# Patient Record
Sex: Female | Born: 1990 | Race: White | Hispanic: No | Marital: Married | Smoking: Never smoker
Health system: Southern US, Community
[De-identification: ages and names within clinical notes are randomized; demographics above are authoritative.]

## PROBLEM LIST (undated history)

## (undated) ENCOUNTER — Inpatient Hospital Stay (HOSPITAL_COMMUNITY): Payer: Self-pay

## (undated) ENCOUNTER — Inpatient Hospital Stay: Payer: Self-pay

## (undated) DIAGNOSIS — Z309 Encounter for contraceptive management, unspecified: Secondary | ICD-10-CM

## (undated) HISTORY — PX: SKIN BIOPSY: SHX1

## (undated) HISTORY — DX: Encounter for contraceptive management, unspecified: Z30.9

---

## 2016-05-02 DIAGNOSIS — D239 Other benign neoplasm of skin, unspecified: Secondary | ICD-10-CM

## 2016-05-02 HISTORY — DX: Other benign neoplasm of skin, unspecified: D23.9

## 2016-07-05 ENCOUNTER — Encounter: Payer: Self-pay | Admitting: Obstetrics and Gynecology

## 2016-11-27 ENCOUNTER — Telehealth: Payer: Self-pay | Admitting: Certified Nurse Midwife

## 2016-11-27 NOTE — Telephone Encounter (Signed)
Patient coming 4/18 for Mirena insert with CLG at 2:10pm

## 2016-12-17 NOTE — Telephone Encounter (Signed)
Noted. Will order to arrive by apt date/time. 

## 2016-12-18 ENCOUNTER — Ambulatory Visit: Payer: Self-pay | Admitting: Certified Nurse Midwife

## 2016-12-20 ENCOUNTER — Ambulatory Visit (INDEPENDENT_AMBULATORY_CARE_PROVIDER_SITE_OTHER): Payer: BLUE CROSS/BLUE SHIELD | Admitting: Obstetrics and Gynecology

## 2016-12-20 ENCOUNTER — Encounter: Payer: Self-pay | Admitting: Obstetrics and Gynecology

## 2016-12-20 VITALS — BP 100/60 | HR 80 | Ht 65.0 in | Wt 172.0 lb

## 2016-12-20 DIAGNOSIS — Z30011 Encounter for initial prescription of contraceptive pills: Secondary | ICD-10-CM | POA: Diagnosis not present

## 2016-12-20 MED ORDER — NORGESTIMATE-ETH ESTRADIOL 0.25-35 MG-MCG PO TABS: 1.0000 | ORAL_TABLET | Freq: Every day | ORAL | 11 refills | Status: DC

## 2016-12-20 NOTE — Progress Notes (Signed)
    Obstetrics & Gynecology Office Visit   Chief Complaint  Patient presents with  . Contraception counseling    History of Present Illness: Patient is a 26 y.o. G0P0000 presenting for contraception consult.  She is currently on none and desiring to start OCP (estrogen/progesterone).  She has a past medical history significant for no contraindication to estrogen.  She specifically denies a history of migraine with aura, chronic hypertension, history of DVT/PE and smoking.  Reported Patient's last menstrual period was 12/19/2016.Marland Kitchen     Review of Systems: Review of Systems  Constitutional: Negative.   HENT: Negative.   Eyes: Negative.   Respiratory: Negative.   Cardiovascular: Negative.   Gastrointestinal: Negative.   Genitourinary: Negative.   Musculoskeletal: Negative.   Skin: Negative.   Neurological: Negative.   Psychiatric/Behavioral: Negative.     Past Medical History:  History reviewed. No pertinent past medical history.  Past Surgical History:  History reviewed. No pertinent surgical history.  Gynecologic History: Patient's last menstrual period was 12/19/2016.  Obstetric History: G0P0000  Family History  Problem Relation Age of Onset  . Skin cancer Paternal Grandmother     Social History   Social History  . Marital status: Single    Spouse name: N/A  . Number of children: N/A  . Years of education: N/A   Occupational History  . Not on file.   Social History Main Topics  . Smoking status: Never Smoker  . Smokeless tobacco: Never Used  . Alcohol use Yes  . Drug use: No  . Sexual activity: No   Other Topics Concern  . Not on file   Social History Narrative  . No narrative on file    Allergies: No Known Allergies  Medications: Prior to Admission medications   None   Family History  Problem Relation Age of Onset  . Skin cancer Paternal Grandmother     Physical Exam Vitals:  Vitals:   12/20/16 1515  BP: 100/60  Pulse: 80   Patient's  last menstrual period was 12/19/2016.  General: NAD HEENT: normocephalic, anicteric Pulmonary: No increased work of breathing Extremities: no edema, erythema, or tenderness Neurologic: Grossly intact Psychiatric: mood appropriate, affect full   Assessment: 26 y.o. G0P0000 here for contraception counseling  Plan: Reviewed all forms of birth control options available including abstinence; over the counter/barrier methods; hormonal contraceptive medication including pill, patch, ring, injection,contraceptive implant; hormonal and nonhormonal IUDs; permanent sterilization options including vasectomy and the various tubal sterilization modalities. Risks and benefits reviewed.  Questions were answered.  Information was given to patient to review.   Patient plans OCPs for contraception. Pros and cons and systemic estrogen discussed with patient. She is not breast feeding, nor does she have any other medical contra-indications to estrogen use as listed in WHO guidelines for contraceptive use.  While effective at preventing pregnancy combination oral contraceptive pills do not prevent transmission of sexually transmitted diseases and use of barrier methods for this purpose was discussed.    Encounter for initial prescription of contraceptive pills - Plan: norgestimate-ethinyl estradiol (ORTHO-CYCLEN,SPRINTEC,PREVIFEM) 0.25-35 MG-MCG tablet    Prentice Docker, MD 12/20/2016 3:47 PM

## 2016-12-20 NOTE — Telephone Encounter (Signed)
Pt is schedule 12/20/16 with Dr. Glennon Mac for Mirena insertion

## 2017-01-01 ENCOUNTER — Ambulatory Visit: Payer: BLUE CROSS/BLUE SHIELD | Admitting: Certified Nurse Midwife

## 2017-08-25 ENCOUNTER — Ambulatory Visit: Payer: BLUE CROSS/BLUE SHIELD | Admitting: Certified Nurse Midwife

## 2017-10-03 ENCOUNTER — Encounter: Payer: Self-pay | Admitting: Certified Nurse Midwife

## 2017-10-03 ENCOUNTER — Ambulatory Visit (INDEPENDENT_AMBULATORY_CARE_PROVIDER_SITE_OTHER): Payer: BLUE CROSS/BLUE SHIELD | Admitting: Certified Nurse Midwife

## 2017-10-03 VITALS — BP 114/74 | HR 71 | Ht 66.0 in | Wt 184.0 lb

## 2017-10-03 DIAGNOSIS — Z309 Encounter for contraceptive management, unspecified: Secondary | ICD-10-CM | POA: Insufficient documentation

## 2017-10-03 DIAGNOSIS — Z01419 Encounter for gynecological examination (general) (routine) without abnormal findings: Secondary | ICD-10-CM

## 2017-10-03 DIAGNOSIS — Z124 Encounter for screening for malignant neoplasm of cervix: Secondary | ICD-10-CM

## 2017-10-03 DIAGNOSIS — Z30014 Encounter for initial prescription of intrauterine contraceptive device: Secondary | ICD-10-CM

## 2017-10-03 HISTORY — DX: Encounter for contraceptive management, unspecified: Z30.9

## 2017-10-03 NOTE — Progress Notes (Signed)
Gynecology Annual Exam  PCP: Patient, No Pcp Per  Chief Complaint:  Chief Complaint  Patient presents with  . Gynecologic Exam    History of Present Illness:Bethany Aguilar is a 27 year old female, G0 P0000, who presents for her annual gyn exam. She is having no significant GYN problems. Married Dominica Severin this past year and is enjoying married life. Her menses are regular and her LMP was 09/23/2017. They occur every 1 month, they last 5 days, are medium flow, and are without clots.  She has had no spotting.  She reports dysmenorrhea. She uses heating pad and occasionally uses ibuprofen 600 mgm with symptomatic relief.  The patient's past medical history is unremarkable.  Since her last annual GYN exam 08/16/2016 , she started taking Sprintec. Has gained 25# since her last visit. She just started back exercising to help her lose some weight. She is wanting to switch her method of BC to the Paraguard IUD. She is sexually active. No dyspareunia Her most recent pap smear was obtained 1 year ago and was normal.  Mammogram is not applicable.  There is no family history of breast cancer.  There is no family history of ovarian cancer.  The patient does do occ self breast exams.  The patient does not smoke.  The patient does drink 1-2x /month  The patient does not use illegal drugs.  The patient exercises regularly.  The patient does get adequate calcium in her diet.  She has not had a recent cholesterol screen and is interested in labwork.     Review of Systems: Review of Systems  Constitutional: Negative for chills, fever and weight loss.       Positive for weight gain  HENT: Negative for congestion, sinus pain and sore throat.   Eyes: Negative for blurred vision and pain.  Respiratory: Negative for hemoptysis, shortness of breath and wheezing.   Cardiovascular: Negative for chest pain, palpitations and leg swelling.  Gastrointestinal: Negative for abdominal pain, blood in stool,  diarrhea, heartburn, nausea and vomiting.  Genitourinary: Negative for dysuria, frequency, hematuria and urgency.       Positive for dysmenorrhea  Musculoskeletal: Positive for joint pain (knees). Negative for back pain and myalgias.  Skin: Negative for itching and rash.  Neurological: Negative for dizziness, tingling and headaches.  Endo/Heme/Allergies: Negative for environmental allergies and polydipsia. Does not bruise/bleed easily.       Negative for hirsutism   Psychiatric/Behavioral: Negative for depression. The patient is not nervous/anxious and does not have insomnia.     Past Medical History:  Past Medical History:  Diagnosis Date  . Contraceptive management     Past Surgical History:  Past Surgical History:  Procedure Laterality Date  . SKIN BIOPSY     back, stomach and leg; atypical cells    Family History:  Family History  Problem Relation Age of Onset  . Skin cancer Paternal Grandmother   . Diabetes Maternal Aunt   . Diabetes Maternal Grandfather   . Skin cancer Maternal Grandfather   . Breast cancer Neg Hx   . Ovarian cancer Neg Hx     Social History:  Social History   Socioeconomic History  . Marital status: Married    Spouse name: Dominica Severin  . Number of children: 0  . Years of education: Not on file  . Highest education level: Not on file  Social Needs  . Financial resource strain: Not on file  . Food insecurity - worry: Not on file  .  Food insecurity - inability: Not on file  . Transportation needs - medical: Not on file  . Transportation needs - non-medical: Not on file  Occupational History  . Not on file  Tobacco Use  . Smoking status: Never Smoker  . Smokeless tobacco: Never Used  Substance and Sexual Activity  . Alcohol use: Yes    Comment: 1-2/month  . Drug use: No  . Sexual activity: Yes    Partners: Male    Birth control/protection: Pill  Other Topics Concern  . Not on file  Social History Narrative  . Not on file    Allergies:    No Known Allergies  Medications: Prior to Admission medications   Medication Sig Start Date End Date Taking? Authorizing Provider  norgestimate-ethinyl estradiol (ORTHO-CYCLEN,SPRINTEC,PREVIFEM) 0.25-35 MG-MCG tablet Take 1 tablet by mouth daily. 12/20/16   Will Bonnet, MD    Physical Exam Vitals: BP 114/74   Pulse 71   Ht 5\' 6"  (1.676 m)   Wt 184 lb (83.5 kg)   LMP 09/23/2017 (Exact Date)   BMI 29.70 kg/m   General: WF in NAD HEENT: normocephalic, anicteric Neck: no thyroid enlargement, no palpable nodules, no cervical lymphadenopathy  Pulmonary: No increased work of breathing, CTAB Cardiovascular: RRR, without murmur  Breast: Breast symmetrical, no tenderness, no palpable nodules or masses, no skin or nipple retraction present, no nipple discharge.  No axillary, infraclavicular or supraclavicular lymphadenopathy. Abdomen: Soft, non-tender, non-distended.  Umbilicus without lesions.  No hepatomegaly or masses palpable. No evidence of hernia. Genitourinary:  External: Normal external female genitalia.  Normal urethral meatus, normal Bartholin's and Skene's glands.    Vagina: Normal vaginal mucosa, no evidence of prolapse.    Cervix: Grossly normal in appearance, no bleeding, non-tender, everted  Uterus: Retroverted, normal size, shape, and consistency, mobile, and non-tender  Adnexa: No adnexal masses, non-tender  Rectal: deferred  Lymphatic: no evidence of inguinal lymphadenopathy Extremities: no edema, erythema, or tenderness Neurologic: Grossly intact Psychiatric: mood appropriate, affect full     Assessment: 27 y.o. G0P0000 annual gyn exam Contraceptive management  Plan:   1) Breast cancer screening - recommend monthly self breast exam  2) Cervical cancer screening - Pap was done.   3) Contraception - Education given regarding Paraguard IUD. Explained pros and cons, how the IUD is inserted, possible side effects and risks including expulsion, perforation of  uterus, infection, and irregular bleeding and cramping. Patient to call when on menses to schedule the insertion. Given Paraguard insertion pamphlet.  4) Routine healthcare maintenance including cholesterol and diabetes screening at the time of her IUD insertion.   Dalia Heading, CNM

## 2017-10-07 LAB — IGP,RFX APTIMA HPV ALL PTH: PAP Smear Comment: 0

## 2017-11-08 ENCOUNTER — Other Ambulatory Visit: Payer: Self-pay | Admitting: Obstetrics and Gynecology

## 2017-11-08 DIAGNOSIS — Z30011 Encounter for initial prescription of contraceptive pills: Secondary | ICD-10-CM

## 2017-11-21 ENCOUNTER — Other Ambulatory Visit: Payer: Self-pay | Admitting: Obstetrics and Gynecology

## 2017-11-21 DIAGNOSIS — Z30011 Encounter for initial prescription of contraceptive pills: Secondary | ICD-10-CM

## 2017-11-21 NOTE — Telephone Encounter (Signed)
Pt notified that full year rx was sent by [facility] on 11/08/17 to [facility]

## 2017-11-21 NOTE — Telephone Encounter (Signed)
Pt states she will run out of OCP tomorrow. Pt states pharmacy says she is out of refills. [phone]

## 2018-04-17 ENCOUNTER — Encounter (HOSPITAL_COMMUNITY): Payer: Self-pay | Admitting: Emergency Medicine

## 2018-04-17 ENCOUNTER — Ambulatory Visit (HOSPITAL_COMMUNITY)
Admission: EM | Admit: 2018-04-17 | Discharge: 2018-04-17 | Disposition: A | Discharge status: Home / Self Care | Payer: 59 | Attending: Internal Medicine | Admitting: Internal Medicine

## 2018-04-17 DIAGNOSIS — J029 Acute pharyngitis, unspecified: Secondary | ICD-10-CM | POA: Diagnosis present

## 2018-04-17 LAB — POCT RAPID STREP A: Streptococcus, Group A Screen (Direct): NEGATIVE

## 2018-04-17 MED ORDER — MOMETASONE FUROATE 50 MCG/ACT NA SUSP: 2.0000 | Freq: Every day | NASAL | 12 refills | Status: DC

## 2018-04-17 NOTE — ED Triage Notes (Signed)
Pt c/o sore throat, unsure if strep or congestion.

## 2018-04-17 NOTE — ED Provider Notes (Signed)
[facility]    CSN: [id number] Arrival date & time: 04/17/18  1239     History   Chief Complaint Chief Complaint  Patient presents with  . Appointment    1pm  . Sore Throat    HPI Bethany Aguilar is a 27 y.o. female.   Bethany Aguilar presents with complaints of sore throat and ear pressure bilaterally which is worse when she wakes in the morning. Started two days ago. Symptoms primarily at night and in the morning. Feels well during the day. Had chills at original onset but denies any fevers since. No rash. No nausea or vomiting. Has been taking sudafed which has helped some. No known ill contacts. Slight cough. This morning felt worse than yesterday am, but feels improved currently. Without contributing medical history.     ROS per HPI.      Past Medical History:  Diagnosis Date  . Contraceptive management     Patient Active Problem List   Diagnosis Date Noted  . Contraceptive management 10/03/2017    Past Surgical History:  Procedure Laterality Date  . SKIN BIOPSY     back, stomach and leg; atypical cells    OB History    Gravida  0   Para  0   Term  0   Preterm  0   AB  0   Living  0     SAB  0   TAB  0   Ectopic  0   Multiple  0   Live Births  0            Home Medications    Prior to Admission medications   Medication Sig Start Date End Date Taking? Authorizing Provider  mometasone (NASONEX) 50 MCG/ACT nasal spray Place 2 sprays into the nose daily. 04/17/18   Zigmund Gottron, NP  SPRINTEC 28 0.25-35 MG-MCG tablet TAKE 1 TABLET BY MOUTH DAILY. 11/08/17   Will Bonnet, MD  [place] 28 0.25-35 MG-MCG tablet TAKE 1 TABLET BY MOUTH DAILY. 11/21/17   Will Bonnet, MD    Family History Family History  Problem Relation Age of Onset  . Skin cancer Paternal Grandmother   . Diabetes Maternal Aunt   . Diabetes Maternal Grandfather   . Skin cancer Maternal Grandfather   . Breast cancer Neg Hx   . Ovarian cancer Neg Hx      Social History Social History   Tobacco Use  . Smoking status: Never Smoker  . Smokeless tobacco: Never Used  Substance Use Topics  . Alcohol use: Yes    Comment: 1-2/month  . Drug use: No     Allergies   Patient has no known allergies.   Review of Systems Review of Systems   Physical Exam Triage Vital Signs ED Triage Vitals [04/17/18 1300]  Enc Vitals Group     BP 131/70     Pulse Rate 79     Resp 16     Temp 97.9 F (36.6 C)     Temp src      SpO2 99 %     Weight      Height      Head Circumference      Peak Flow      Pain Score      Pain Loc      Pain Edu?      Excl. in [place]?    No data found.  Updated Vital Signs BP 131/70   Pulse 79  Temp 97.9 F (36.6 C)   Resp 16   SpO2 99%    Physical Exam  Constitutional: She is oriented to person, place, and time. She appears well-developed and well-nourished. No distress.  HENT:  Head: Normocephalic and atraumatic.  Right Ear: Tympanic membrane, external ear and ear canal normal.  Left Ear: Tympanic membrane, external ear and ear canal normal.  Nose: Nose normal.  Mouth/Throat: Uvula is midline, oropharynx is clear and moist and mucous membranes are normal. No tonsillar exudate.  Eyes: Pupils are equal, round, and reactive to light. Conjunctivae and EOM are normal.  Cardiovascular: Normal rate, regular rhythm and normal heart sounds.  Pulmonary/Chest: Effort normal and breath sounds normal.  Lymphadenopathy:    She has no cervical adenopathy.  Neurological: She is alert and oriented to person, place, and time.  Skin: Skin is warm and dry.     UC Treatments / Results  Labs (all labs ordered are listed, but only abnormal results are displayed) Labs Reviewed  CULTURE, GROUP A STREP [facility])  POCT RAPID STREP A    EKG None  Radiology No results found.  Procedures Procedures (including critical care time)  Medications Ordered in UC Medications - No data to display  Initial Impression /  Assessment and Plan / UC Course  I have reviewed the triage vital signs and the nursing notes.  Pertinent labs & imaging results that were available during my care of the patient were reviewed by me and considered in my medical decision making (see chart for details).     Afebrile, non toxic in appearance. Negative rapid strep. Benign physical exam. History and physical consistent with viral illness.  Supportive cares recommended. If symptoms worsen or do not improve in the next week to return to be seen or to follow up with PCP.  Patient verbalized understanding and agreeable to plan.   Final Clinical Impressions(s) / UC Diagnoses   Final diagnoses:  Viral pharyngitis     Discharge Instructions     Push fluids to ensure adequate hydration and keep secretions thin.  Tylenol and/or ibuprofen as needed for pain or fevers.  Daily nasonex. Use of mucinex as an expectorant may be helpful, may continue with sudafed as needed. If symptoms worsen or do not improve in the next week to return to be seen or to follow up with your PCP.     ED Prescriptions    Medication Sig Dispense Auth. Provider   mometasone (NASONEX) 50 MCG/ACT nasal spray Place 2 sprays into the nose daily. 17 g Zigmund Gottron, NP     Controlled Substance Prescriptions [place] Controlled Substance Registry consulted? Not Applicable   Zigmund Gottron, NP 04/17/18 1326

## 2018-04-17 NOTE — ED Notes (Signed)
Bed: UC01 Expected date:  Expected time:  Means of arrival:  Comments: appointments

## 2018-04-17 NOTE — Discharge Instructions (Signed)
Push fluids to ensure adequate hydration and keep secretions thin.  Tylenol and/or ibuprofen as needed for pain or fevers.  Daily nasonex. Use of mucinex as an expectorant may be helpful, may continue with sudafed as needed. If symptoms worsen or do not improve in the next week to return to be seen or to follow up with your PCP.

## 2018-04-19 LAB — CULTURE, GROUP A STREP (THRC)

## 2018-04-21 ENCOUNTER — Telehealth (HOSPITAL_COMMUNITY): Payer: Self-pay

## 2018-04-21 NOTE — Telephone Encounter (Signed)
Culture is positive for non group A Strep germ.  This is a finding of uncertain significance; not the typical 'strep throat' germ.  Attempted to reach patient to follow up. No answer at this time.

## 2018-08-06 ENCOUNTER — Encounter: Payer: Self-pay | Admitting: Certified Nurse Midwife

## 2018-08-06 ENCOUNTER — Ambulatory Visit (INDEPENDENT_AMBULATORY_CARE_PROVIDER_SITE_OTHER): Payer: 59 | Admitting: Certified Nurse Midwife

## 2018-08-06 VITALS — BP 106/70 | HR 78 | Ht 66.0 in | Wt 189.5 lb

## 2018-08-06 DIAGNOSIS — M79605 Pain in left leg: Secondary | ICD-10-CM | POA: Diagnosis not present

## 2018-08-06 DIAGNOSIS — Z30015 Encounter for initial prescription of vaginal ring hormonal contraceptive: Secondary | ICD-10-CM | POA: Diagnosis not present

## 2018-08-06 NOTE — Progress Notes (Signed)
Obstetrics & Gynecology Office Visit   Chief Complaint:  Chief Complaint  Patient presents with  . Contraception    History of Present Illness: Bethany Aguilar is a 27 year old female, G0 P0000, who presents to discuss switching contraceptive method from OCPs to [place]. She currently uses Sprintec. Has gained weight on the Sprintec and complains of mood swings and irritability on the OCPs.  She also reports a chronic pain in her left popliteal area that has worsened with sitting for long periods of time at work   Review of Systems:  ROS   Past Medical History:  Past Medical History:  Diagnosis Date  . Contraceptive management     Past Surgical History:  Past Surgical History:  Procedure Laterality Date  . SKIN BIOPSY     back, stomach and leg; atypical cells    Gynecologic History: Patient's last menstrual period was 08/02/2018.  Obstetric History: G0P0000  Family History:  Family History  Problem Relation Age of Onset  . Skin cancer Paternal Grandmother   . Diabetes Maternal Aunt   . Diabetes Maternal Grandfather   . Skin cancer Maternal Grandfather   . Breast cancer Neg Hx   . Ovarian cancer Neg Hx     Social History:  Social History   Socioeconomic History  . Marital status: Married    Spouse name: Dominica Severin  . Number of children: 0  . Years of education: 72  . Highest education level: Not on file  Occupational History  . 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 Financial resource strain: Not on file  . Food insecurity:    Worry: Not on file    Inability: Not on file  . Transportation needs:    Medical: Not on file    Non-medical: Not on file  Tobacco Use  . Smoking status: Never Smoker  . Smokeless tobacco: Never Used  Substance and Sexual Activity  . Alcohol use: Yes    Comment: 1-2/month  . Drug use: No  . Sexual activity: Yes    Partners: Male    Birth control/protection: Pill  Lifestyle  . Physical activity:    Days per  week: 5 days    Minutes per session: 40 min  . Stress: Not at all  Relationships  . Social connections:    Talks on phone: Three times a week    Gets together: More than three times a week    Attends religious service: More than 4 times per year    Active member of club or organization: Yes    Attends meetings of clubs or organizations: More than 4 times per year    Relationship status: Married  . Intimate partner violence:    Fear of current or ex partner: No    Emotionally abused: No    Physically abused: No    Forced sexual activity: No  Other Topics Concern  . Not on file  Social History Narrative  . Not on file    Allergies:  No Known Allergies  Medications: Prior to Admission medications   Not on File    Physical Exam Vitals:BP 106/70   Pulse 78   Ht 5\' 6"  (1.676 m)   Wt 189 lb 8 oz (86 kg)   LMP 08/02/2018   BMI 30.59 kg/m  Patient's last menstrual period was 08/02/2018.  Physical Exam  Constitutional: She is oriented to person, place, and time. She appears well-developed and well-nourished.  Cardiovascular: Normal rate.  Respiratory: Effort normal.  Neurological: She is alert and oriented to person, place, and time.  Psychiatric: She has a normal mood and affect.  Extremities: No edema of feet and ankles. No calf redness or tenderness On left popliteal area there is swelling and tenderness, no inflammation On right: no swelling or inflammation of popliteal area   Assessment: 27 y.o. G0P0000 left leg pain and popliteal swelling R/O DVT Contraceptive management  Plan: Venous ultrasound of left lower extremitiy Will call  With results If negative for DVT, can start Nuvaring Discussed how to insert, how to use, MOA, possible side effects and risks of thromboembolism. GIven one sample ring. Will call in RX if doppler study is negative. FU at time of annual in Jan/Feb 2020.  Dalia Heading, CNM

## 2018-08-07 ENCOUNTER — Telehealth: Payer: Self-pay | Admitting: Certified Nurse Midwife

## 2018-08-07 NOTE — Telephone Encounter (Signed)
Patient returned the call, and is aware of the appointment and location. Patient was given Centralized Scheduling's phone# in the event she needs to reschedule.

## 2018-08-07 NOTE — Telephone Encounter (Signed)
Patient is scheduled for venous ultrasound on Tuesday, 08/11/18 @ 3:00pm at [facility]. Patient should arrive @ 2:45pm at the [facility] registration desk. Lmtrc.

## 2018-08-11 ENCOUNTER — Ambulatory Visit
Admission: RE | Admit: 2018-08-11 | Discharge: 2018-08-11 | Disposition: A | Discharge status: Home / Self Care | Payer: 59 | Source: Ambulatory Visit | Attending: Certified Nurse Midwife | Admitting: Certified Nurse Midwife

## 2018-08-11 DIAGNOSIS — M79605 Pain in left leg: Secondary | ICD-10-CM | POA: Insufficient documentation

## 2018-08-18 ENCOUNTER — Telehealth: Payer: Self-pay | Admitting: Certified Nurse Midwife

## 2018-08-18 ENCOUNTER — Other Ambulatory Visit: Payer: Self-pay | Admitting: Certified Nurse Midwife

## 2018-08-18 MED ORDER — ETONOGESTREL-ETHINYL ESTRADIOL 0.12-0.015 MG/24HR VA RING: VAGINAL_RING | VAGINAL | 4 refills | Status: DC

## 2018-08-18 NOTE — Telephone Encounter (Signed)
Patient called and advised there was no evidence of DVT on recent Doppler study of left lower leg. RX for Nuvaring sent to pharmacy. Can start it's use. Dalia Heading, CNM

## 2018-08-27 ENCOUNTER — Other Ambulatory Visit: Payer: Self-pay | Admitting: Certified Nurse Midwife

## 2018-08-31 ENCOUNTER — Telehealth: Payer: Self-pay

## 2018-08-31 NOTE — Telephone Encounter (Signed)
Pt calling to see why nuvaring refill was refused.  [phone].  Called pharm; they said she p/u refill on the 3rd and has 2refills left.  Called pt.  She is now aware refused on the 12th d/t eRx'd on the 3rd 1 c 4rf.  Pt to contact pharmacy.

## 2018-10-06 ENCOUNTER — Ambulatory Visit: Payer: 59 | Admitting: Certified Nurse Midwife

## 2018-11-03 NOTE — Progress Notes (Signed)
Gynecology Annual Exam  PCP: Patient, No Pcp Per  Chief Complaint:  Chief Complaint  Patient presents with  . Gynecologic Exam    History of Present Illness:Bethany Aguilar is a 28 year old female, G0 P0000, who presents for her annual gyn exam. She had switched to using Nuvaring from [place] and has had less mood swings and has not gained anymore weight. She has had a decreased sex drive since starting to use OCPs.  Her menses are regular and her LMP was 10/17/18. They occur every 1 month, they last 4 days, are light to medium flow, and are without clots.  She has had no spotting.  She reports mild dysmenorrhea. She uses heating pad and occasionally uses ibuprofen 600 mgm with symptomatic relief.  The patient's past medical history is unremarkable.  Since her last annual GYN exam 10/03/2017 , she was seen for contraception management in November and reported popliteal pain on the Sprintec. She had a negative Doppler study for DVT. She also had some atypical moles removed 4 mos ago by dermatology (Dr Nehemiah Massed) She is sexually active. No dyspareunia Her most recent pap smear was obtained 10/03/17 and was normal.  Mammogram is not applicable.  There is no family history of breast cancer.  There is no family history of ovarian cancer.  The patient does do self breast exams.  The patient does not smoke.  The patient rarely drinks alcohol. The patient does not use illegal drugs.  The patient exercises regularly 2-3x/week The patient does get adequate calcium in her diet.  She has not had a recent cholesterol screen and is not interested in labwork.     Review of Systems: Review of Systems  Constitutional: Negative for chills, fever and weight loss.       Decreased appetite  HENT: Negative for congestion, sinus pain and sore throat.   Eyes: Negative for blurred vision and pain.  Respiratory: Negative for hemoptysis, shortness of breath and wheezing.   Cardiovascular: Negative  for chest pain, palpitations and leg swelling.  Gastrointestinal: Positive for nausea and vomiting. Negative for abdominal pain, blood in stool, diarrhea and heartburn.  Genitourinary: Negative for dysuria, frequency, hematuria and urgency.       Positive for dysmenorrhea and vaginal discharge; positive for decreased libido.  Musculoskeletal: Positive for joint pain (knees). Negative for back pain and myalgias.  Skin: Negative for itching and rash.  Neurological: Positive for headaches. Negative for dizziness and tingling.  Endo/Heme/Allergies: Negative for environmental allergies and polydipsia. Does not bruise/bleed easily.       Negative for hirsutism   Psychiatric/Behavioral: Negative for depression. The patient is not nervous/anxious and does not have insomnia.     Past Medical History:  Past Medical History:  Diagnosis Date  . Contraceptive management     Past Surgical History:  Past Surgical History:  Procedure Laterality Date  . SKIN BIOPSY     back, stomach and leg; atypical cells    Family History:  Family History  Problem Relation Age of Onset  . Skin cancer Paternal Grandmother   . Diabetes Maternal Aunt   . Diabetes Maternal Grandfather   . Skin cancer Maternal Grandfather   . Breast cancer Neg Hx   . Ovarian cancer Neg Hx     Social History:  Social History   Socioeconomic History  . Marital status: Married    Spouse name: Dominica Severin  . Number of children: 0  . Years of education: 22  .  Highest education level: Not on file  Occupational History  . 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 Financial resource strain: Not on file  . Food insecurity:    Worry: Not on file    Inability: Not on file  . Transportation needs:    Medical: Not on file    Non-medical: Not on file  Tobacco Use  . Smoking status: Never Smoker  . Smokeless tobacco: Never Used  Substance and Sexual Activity  . Alcohol use: Yes    Comment: 1-2/month  . Drug use: No  .  Sexual activity: Yes    Partners: Male    Birth control/protection: Inserts  Lifestyle  . Physical activity:    Days per week: 5 days    Minutes per session: 40 min  . Stress: Not at all  Relationships  . Social connections:    Talks on phone: Three times a week    Gets together: More than three times a week    Attends religious service: More than 4 times per year    Active member of club or organization: Yes    Attends meetings of clubs or organizations: More than 4 times per year    Relationship status: Married  . Intimate partner violence:    Fear of current or ex partner: No    Emotionally abused: No    Physically abused: No    Forced sexual activity: No  Other Topics Concern  . Not on file  Social History Narrative  . Not on file    Allergies:  No Known Allergies  Medications: Scheduled Meds: Continuous Infusions: PRN Meds:. Physical Exam Vitals: BP 118/70   Pulse 78   Ht 5\' 6"  (1.676 m)   Wt 184 lb (83.5 kg)   LMP 10/17/2018 (Exact Date)   BMI 29.70 kg/m   General: WF in NAD HEENT: normocephalic, anicteric Neck: no thyroid enlargement, no palpable nodules, no cervical lymphadenopathy  Pulmonary: No increased work of breathing, CTAB Cardiovascular: RRR, without murmur  Breast: Breast symmetrical, no tenderness, no palpable nodules or masses, no skin or nipple retraction present, no nipple discharge.  No axillary, infraclavicular or supraclavicular lymphadenopathy. Abdomen: Soft, non-tender, non-distended.  Umbilicus without lesions.  No hepatomegaly or masses palpable. No evidence of hernia. Genitourinary:  External: Normal external female genitalia.  Normal urethral meatus, normal Bartholin's and Skene's glands.    Vagina: Normal vaginal mucosa, no evidence of prolapse.    Cervix: Grossly normal in appearance, non-tender, everted  Uterus: Retroverted, normal size, shape, and consistency, mobile, and non-tender  Adnexa: No adnexal masses,  non-tender  Rectal: deferred  Lymphatic: no evidence of inguinal lymphadenopathy Extremities: no edema, erythema, or tenderness Neurologic: Grossly intact Psychiatric: mood appropriate, affect full     Assessment: 28 y.o. G0P0000 annual gyn exam Contraceptive management  Plan:   1) Breast cancer screening - recommend monthly self breast exam  2) Cervical cancer screening - Pap was done.   3) Contraception - Discussed decreased libido is a possible side effect from any estrogen containing contraception. Explored other forms of contraception. Patient decided to stay on Nuvaring for now.   4) RTO 1 year and prn.   Dalia Heading, CNM

## 2018-11-04 ENCOUNTER — Other Ambulatory Visit (HOSPITAL_COMMUNITY)
Admission: RE | Admit: 2018-11-04 | Discharge: 2018-11-04 | Disposition: A | Discharge status: Home / Self Care | Payer: BLUE CROSS/BLUE SHIELD | Source: Ambulatory Visit | Attending: Certified Nurse Midwife | Admitting: Certified Nurse Midwife

## 2018-11-04 ENCOUNTER — Ambulatory Visit (INDEPENDENT_AMBULATORY_CARE_PROVIDER_SITE_OTHER): Payer: BLUE CROSS/BLUE SHIELD | Admitting: Certified Nurse Midwife

## 2018-11-04 ENCOUNTER — Encounter: Payer: Self-pay | Admitting: Certified Nurse Midwife

## 2018-11-04 VITALS — BP 118/70 | HR 78 | Ht 66.0 in | Wt 184.0 lb

## 2018-11-04 DIAGNOSIS — Z01419 Encounter for gynecological examination (general) (routine) without abnormal findings: Secondary | ICD-10-CM

## 2018-11-04 DIAGNOSIS — Z124 Encounter for screening for malignant neoplasm of cervix: Secondary | ICD-10-CM | POA: Diagnosis present

## 2018-11-04 MED ORDER — ETONOGESTREL-ETHINYL ESTRADIOL 0.12-0.015 MG/24HR VA RING: VAGINAL_RING | VAGINAL | 4 refills | Status: DC

## 2018-11-06 LAB — CYTOLOGY - PAP: Diagnosis: NEGATIVE

## 2019-07-21 ENCOUNTER — Telehealth: Payer: Self-pay | Admitting: Certified Nurse Midwife

## 2019-07-21 NOTE — Telephone Encounter (Signed)
12/10 at 130 with CLG for paraguard

## 2019-07-22 NOTE — Telephone Encounter (Signed)
Noted. Will order to arrive by apt date/time. 

## 2019-08-26 ENCOUNTER — Other Ambulatory Visit: Payer: Self-pay

## 2019-08-26 ENCOUNTER — Encounter: Payer: Self-pay | Admitting: Certified Nurse Midwife

## 2019-08-26 ENCOUNTER — Ambulatory Visit (INDEPENDENT_AMBULATORY_CARE_PROVIDER_SITE_OTHER): Payer: BC Managed Care – PPO | Admitting: Certified Nurse Midwife

## 2019-08-26 VITALS — BP 124/70 | HR 71 | Temp 99.3°F | Ht 65.0 in | Wt 159.0 lb

## 2019-08-26 DIAGNOSIS — Z30430 Encounter for insertion of intrauterine contraceptive device: Secondary | ICD-10-CM

## 2019-08-26 DIAGNOSIS — Z3009 Encounter for other general counseling and advice on contraception: Secondary | ICD-10-CM

## 2019-08-26 NOTE — Progress Notes (Signed)
    GYNECOLOGY OFFICE PROCEDURE NOTE  Bethany Aguilar is a 28 y.o. G0P0000 here for Paragard IUD insertion. No GYN concerns.  Last pap smear was on 11/04/2018 and was normal. She is currently using Nuvaring for contraception and desires to switch to a nonhormonal method. She has experienced mood swings and decreased libido on Nuvaring and Sprintec. Her LMP is 08/25/2019  IUD Insertion Procedure Note Patient identified, informed consent performed, consent signed.   Discussed risks of irregular bleeding, cramping, infection, expulsion,malpositioning or misplacement of the IUD. Time out was performed.    On bimanual exam, uterus was Retroverted Speculum placed in the vagina.  Cervix visualized.  Cleaned with Betadine x 2. Cervix was sprayed with Hurricaine anesthetic and  grasped anteriorly with a single tooth tenaculum.  Uterus sounded to 6 cm.  Paragard  IUD placed per manufacturer's recommendations.  Strings trimmed to 3 cm. Tenaculum was removed, and silver nitrate was applied to tenaculum sites for hemostasis.  Patient tolerated procedure well.   Patient was given post-procedure instructions.  She was advised to have backup contraception for one week.  Patient was also asked to check IUD strings periodically and follow up in 4 weeks for IUD check.  Dalia Heading, CNM 08/26/19

## 2019-08-29 NOTE — Addendum Note (Signed)
Addended by: Dalia Heading on: 08/29/2019 07:13 PM   Modules accepted: Level of Service

## 2019-09-24 ENCOUNTER — Ambulatory Visit (INDEPENDENT_AMBULATORY_CARE_PROVIDER_SITE_OTHER): Payer: 59 | Admitting: Certified Nurse Midwife

## 2019-09-24 ENCOUNTER — Other Ambulatory Visit: Payer: Self-pay

## 2019-09-24 ENCOUNTER — Encounter: Payer: Self-pay | Admitting: Certified Nurse Midwife

## 2019-09-24 VITALS — BP 104/60 | Ht 66.0 in | Wt 163.0 lb

## 2019-09-24 DIAGNOSIS — Z30432 Encounter for removal of intrauterine contraceptive device: Secondary | ICD-10-CM | POA: Diagnosis not present

## 2019-09-24 DIAGNOSIS — T8332XA Displacement of intrauterine contraceptive device, initial encounter: Secondary | ICD-10-CM

## 2019-09-24 NOTE — Progress Notes (Signed)
  History of Present Illness:  Bethany Aguilar is a 29 y.o. that had a Paragard IUD placed approximately 1 month ago. Since that time, she states that she has had a little heavier menses 09/21/2019. Has not been able to feel her strings recently  PMHx: She  has a past medical history of Contraceptive management. Also,  has a past surgical history that includes Skin biopsy., family history includes Diabetes in her maternal aunt and maternal grandfather; Skin cancer in her maternal grandfather and paternal grandmother; Throat cancer (age of onset: 53) in her maternal aunt.,  reports that she has never smoked. She has never used smokeless tobacco. She reports current alcohol use. She reports that she does not use drugs.  She has a current medication list which includes the following prescription(s): copper. Also, has No Known Allergies.  ROS  Physical Exam:  BP 104/60   Ht 5\' 6"  (1.676 m)   Wt 163 lb (73.9 kg)   LMP 09/21/2019 (Exact Date)   BMI 26.31 kg/m  Body mass index is 26.31 kg/m. Constitutional: Well nourished, well developed female in no acute distress.  Neuro: Grossly intact Psych:  Normal mood and affect.    Pelvic exam: External/BUS: no lesion, no discharge Vagina: no lesions, scant bleeding Cervix: the end of the IUD is protruding thru the cervical os.   Assessment: IUD is partially expelled  Plan: Discussed with [facility] that the recommended plan is to remove the IUD and insert a new one if she desires or utilize a different form of contraception. She desires the IUD removed and she will use condoms for now.. Also discussed the minipill (less hormones). And use of spermicide as a second contraceptive. Reminded about emergency contraception for a condom failure. THe end of the IUD was grasped with a bottle neck forceps and removed intact  Will see her back for her annual and prn.  Dalia Heading, CNM

## 2019-09-27 ENCOUNTER — Telehealth: Payer: Self-pay

## 2019-09-27 MED ORDER — PHEXXI 1.8-1-0.4 % VA GEL: 1.0000 | Freq: Every day | VAGINAL | 11 refills | Status: DC | PRN

## 2019-09-27 NOTE — Telephone Encounter (Signed)
Returned patient call. Interested in trying Phexxi. Given instructions on how to use, effectiveness and method of action. RX for 12 applicators with refill along with savings card and instructions for use placed at front desk. Encouraged use with condoms for better effectiveness. Dalia Heading, CNM

## 2019-09-27 NOTE — Telephone Encounter (Signed)
Pt calling triage today stating she is wanting a non hormonal Birth control, just had Paragard removed. Pt was doing some research and saw a non hormonal birth control gel called Phexxi. Wanting to know if CG knows anything about this or if she could RX this for her? Please advise. Thank you.

## 2019-09-28 ENCOUNTER — Telehealth: Payer: Self-pay

## 2019-09-28 ENCOUNTER — Telehealth: Payer: Self-pay | Admitting: Certified Nurse Midwife

## 2019-09-28 MED ORDER — PHEXXI 1.8-1-0.4 % VA GEL: 1.0000 | Freq: Every day | VAGINAL | 11 refills | Status: DC | PRN

## 2019-09-28 NOTE — Telephone Encounter (Signed)
Pt aware rx eRx'd to pharm c confirmation received.

## 2019-09-28 NOTE — Telephone Encounter (Signed)
Patient is calling about a prescription that was suppose to be sent to [facility] for Lactic Ac-Citric Ac-Pot Bitart (PHEXXI) 1.8-1-0.4 % GEL. Patient is stating the pharmacy doesn't have it. Would you please contact pharmacy

## 2019-11-22 NOTE — Telephone Encounter (Signed)
Paragard provided for this patient. Rcvd/charged 08/26/2019

## 2020-06-07 ENCOUNTER — Encounter: Payer: Self-pay | Admitting: Dermatology

## 2020-06-07 ENCOUNTER — Ambulatory Visit: Payer: BC Managed Care – PPO | Admitting: Dermatology

## 2020-06-07 ENCOUNTER — Other Ambulatory Visit: Payer: Self-pay

## 2020-06-07 DIAGNOSIS — D485 Neoplasm of uncertain behavior of skin: Secondary | ICD-10-CM | POA: Diagnosis not present

## 2020-06-07 DIAGNOSIS — L700 Acne vulgaris: Secondary | ICD-10-CM | POA: Diagnosis not present

## 2020-06-07 DIAGNOSIS — D229 Melanocytic nevi, unspecified: Secondary | ICD-10-CM

## 2020-06-07 DIAGNOSIS — D225 Melanocytic nevi of trunk: Secondary | ICD-10-CM

## 2020-06-07 DIAGNOSIS — Z86018 Personal history of other benign neoplasm: Secondary | ICD-10-CM

## 2020-06-07 DIAGNOSIS — L578 Other skin changes due to chronic exposure to nonionizing radiation: Secondary | ICD-10-CM | POA: Diagnosis not present

## 2020-06-07 MED ORDER — ADAPALENE 0.3 % EX GEL: 1.0000 "application " | Freq: Every day | CUTANEOUS | 6 refills | Status: DC

## 2020-06-07 MED ORDER — DOXYCYCLINE HYCLATE 50 MG PO CAPS: 50.0000 mg | ORAL_CAPSULE | Freq: Every day | ORAL | 6 refills | Status: DC

## 2020-06-07 NOTE — Progress Notes (Signed)
Follow-Up Visit   Subjective  Bethany Aguilar is a 29 y.o. female who presents for the following: Annual Exam (History of dysplastic nevi - TBSE today) and Acne (seems to be worse in the last month or so). The patient presents for Total-Body Skin Exam (TBSE) for skin cancer screening and mole check.  The following portions of the chart were reviewed this encounter and updated as appropriate:  Tobacco  Allergies  Meds  Problems  Med Hx  Surg Hx  Fam Hx     Review of Systems:  No other skin or systemic complaints except as noted in HPI or Assessment and Plan.  Objective  Well appearing patient in no apparent distress; mood and affect are within normal limits.  A full examination was performed including scalp, head, eyes, ears, nose, lips, neck, chest, axillae, abdomen, back, buttocks, bilateral upper extremities, bilateral lower extremities, hands, feet, fingers, toes, fingernails, and toenails. All findings within normal limits unless otherwise noted below.  Objective  Face: Pink papules   Objective  Right ant axillary fold: 0.6 cm irregular brown macule     Objective  Left scapula: 0.8 cm irregular brown macule  Objective  Left mid back 6.0 cm lat to spine: 0.6 cm irregular brown macule  Objective  Right sup medial scapula: 0.7 cm irregular brown macule  Objective  Right Lower Back: 1.2 cm irregular brown macule  Objective  trunk: Multiple brown macules, some irregular.  Images               Assessment & Plan    History of Dysplastic Nevi - No evidence of recurrence today - Recommend regular full body skin exams - Recommend daily broad spectrum sunscreen SPF 30+ to sun-exposed areas, reapply every 2 hours as needed.  - Call if any new or changing lesions are noted between office visits  Acne vulgaris Face Start: Doxycycline 50 mg 1 po qd with food and plenty of fluid,  Differin 0.3% gel qhs   May consider Spironolactone in the  future.  doxycycline (VIBRAMYCIN) 50 MG capsule - Face  Adapalene (DIFFERIN) 0.3 % gel - Face  Neoplasm of uncertain behavior of skin (5) Right ant axillary fold  Epidermal / dermal shaving  Lesion diameter (cm):  0.6 Informed consent: discussed and consent obtained   Timeout: patient name, date of birth, surgical site, and procedure verified   Procedure prep:  Patient was prepped and draped in usual sterile fashion Prep type:  Isopropyl alcohol Anesthesia: the lesion was anesthetized in a standard fashion   Anesthetic:  1% lidocaine w/ epinephrine 1-100,000 buffered w/ 8.4% NaHCO3 Instrument used: flexible razor blade   Hemostasis achieved with: pressure, aluminum chloride and electrodesiccation   Outcome: patient tolerated procedure well   Post-procedure details: sterile dressing applied and wound care instructions given   Dressing type: bandage and petrolatum    Specimen 1 - Surgical pathology Differential Diagnosis Nevus vs dysplastic nevus Check Margins: No 0.6 cm irregular brown macule  Left scapula  Epidermal / dermal shaving  Lesion diameter (cm):  0.7 Informed consent: discussed and consent obtained   Timeout: patient name, date of birth, surgical site, and procedure verified   Procedure prep:  Patient was prepped and draped in usual sterile fashion Prep type:  Isopropyl alcohol Anesthesia: the lesion was anesthetized in a standard fashion   Anesthetic:  1% lidocaine w/ epinephrine 1-100,000 buffered w/ 8.4% NaHCO3 Instrument used: flexible razor blade   Hemostasis achieved with: pressure, aluminum chloride and  electrodesiccation   Outcome: patient tolerated procedure well   Post-procedure details: sterile dressing applied and wound care instructions given   Dressing type: bandage and petrolatum    Specimen 2 - Surgical pathology Differential Diagnosis: evus vs dysplastic nevus Check Margins: No 0.8 cm irregular brown macule  Left mid back 6.0 cm lat to  spine  Epidermal / dermal shaving  Lesion diameter (cm):  0.6 Informed consent: discussed and consent obtained   Timeout: patient name, date of birth, surgical site, and procedure verified   Procedure prep:  Patient was prepped and draped in usual sterile fashion Prep type:  Isopropyl alcohol Anesthesia: the lesion was anesthetized in a standard fashion   Anesthetic:  1% lidocaine w/ epinephrine 1-100,000 buffered w/ 8.4% NaHCO3 Instrument used: flexible razor blade   Hemostasis achieved with: pressure, aluminum chloride and electrodesiccation   Outcome: patient tolerated procedure well   Post-procedure details: sterile dressing applied and wound care instructions given   Dressing type: bandage and petrolatum    Specimen 3 - Surgical pathology Differential Diagnosis: Nevus vs dysplastic nevus Check Margins: No 0.6 cm irregular brown macule  Right sup medial scapula  Epidermal / dermal shaving  Lesion diameter (cm):  0.8 Informed consent: discussed and consent obtained   Timeout: patient name, date of birth, surgical site, and procedure verified   Procedure prep:  Patient was prepped and draped in usual sterile fashion Prep type:  Isopropyl alcohol Anesthesia: the lesion was anesthetized in a standard fashion   Anesthetic:  1% lidocaine w/ epinephrine 1-100,000 buffered w/ 8.4% NaHCO3 Instrument used: flexible razor blade   Hemostasis achieved with: pressure, aluminum chloride and electrodesiccation   Outcome: patient tolerated procedure well   Post-procedure details: sterile dressing applied and wound care instructions given   Dressing type: bandage and petrolatum    Specimen 4 - Surgical pathology Differential Diagnosis: Nevus vs dysplastic nevus Check Margins: No 0.7 cm irregular brown macule  Right Lower Back  Epidermal / dermal shaving  Lesion diameter (cm):  1.2 Informed consent: discussed and consent obtained   Timeout: patient name, date of birth, surgical  site, and procedure verified   Procedure prep:  Patient was prepped and draped in usual sterile fashion Prep type:  Isopropyl alcohol Anesthesia: the lesion was anesthetized in a standard fashion   Anesthetic:  1% lidocaine w/ epinephrine 1-100,000 buffered w/ 8.4% NaHCO3 Instrument used: flexible razor blade   Hemostasis achieved with: pressure, aluminum chloride and electrodesiccation   Outcome: patient tolerated procedure well   Post-procedure details: sterile dressing applied and wound care instructions given   Dressing type: bandage and petrolatum    Specimen 5 - Surgical pathology Differential Diagnosis: Nevus vs dysplastic nevus Check Margins: No 1.2 cm irregular brown macule  Nevus - multiple - slightly irregular trunk Benign-appearing.  Observation.  Call clinic for new or changing moles.  Recommend daily use of broad spectrum spf 30+ sunscreen to sun-exposed areas. Photos today.    Actinic Damage - diffuse scaly erythematous macules with underlying dyspigmentation - Recommend daily broad spectrum sunscreen SPF 30+ to sun-exposed areas, reapply every 2 hours as needed.  - Call for new or changing lesions.  Return in about 2 months (around 08/07/2020) for acne follow up then 6 mo UBSE.  I, Ashok Cordia, CMA, am acting as scribe for Sarina Ser, MD .  Documentation: I have reviewed the above documentation for accuracy and completeness, and I agree with the above.  Sarina Ser, MD

## 2020-06-07 NOTE — Patient Instructions (Signed)

## 2020-06-08 ENCOUNTER — Encounter: Payer: Self-pay | Admitting: Dermatology

## 2020-06-14 ENCOUNTER — Encounter: Payer: Self-pay | Admitting: Dermatology

## 2020-06-17 IMAGING — US US EXTREM LOW VENOUS*L*
1 series · 13 of 24 positions shown · non-contrast
Comparison: None.

CLINICAL DATA: Acute left leg pain



[Series 1: us extrem low venous*left* · 13 of 34 slices shown]
[im 1/34]
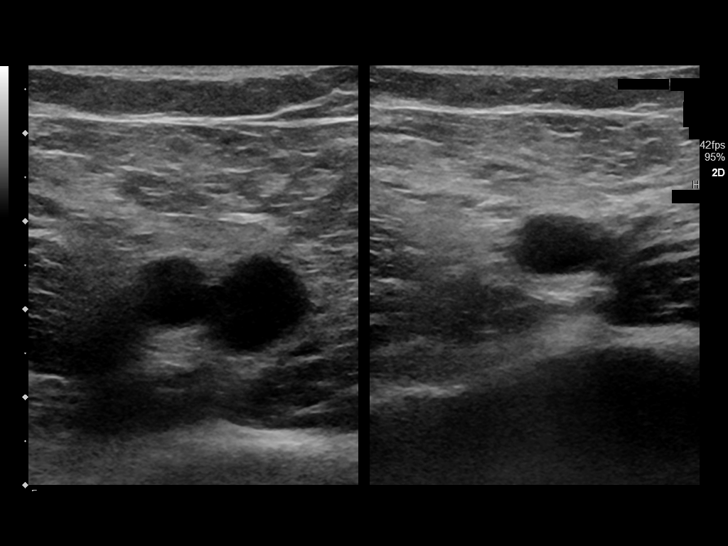
[im 3/34]
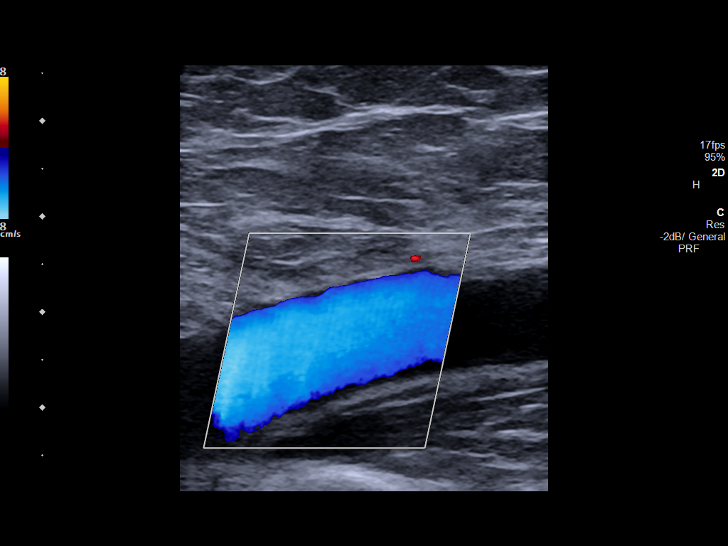
[im 6/34]
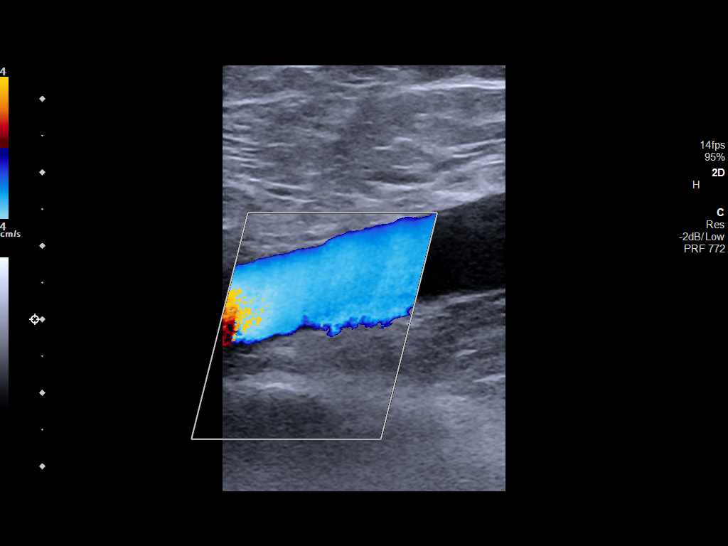
[im 9/34]
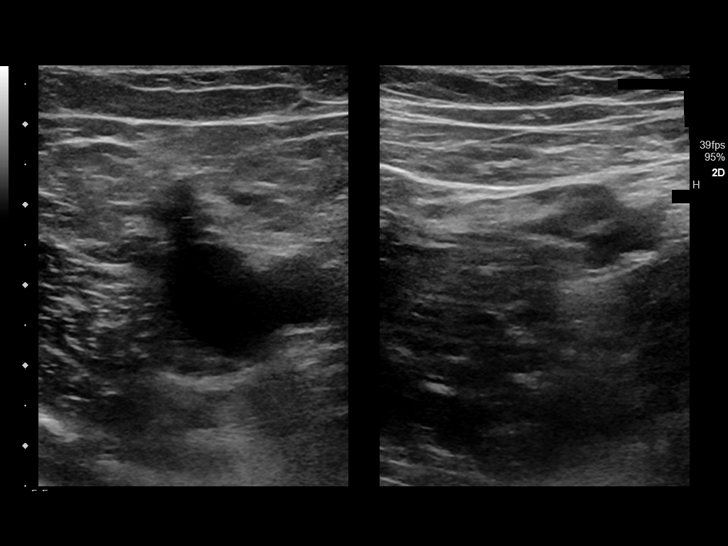
[im 12/34]
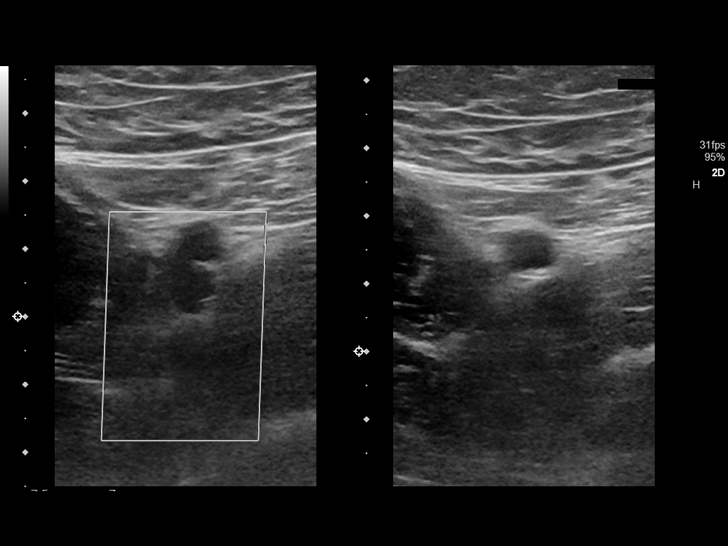
[im 15/34]
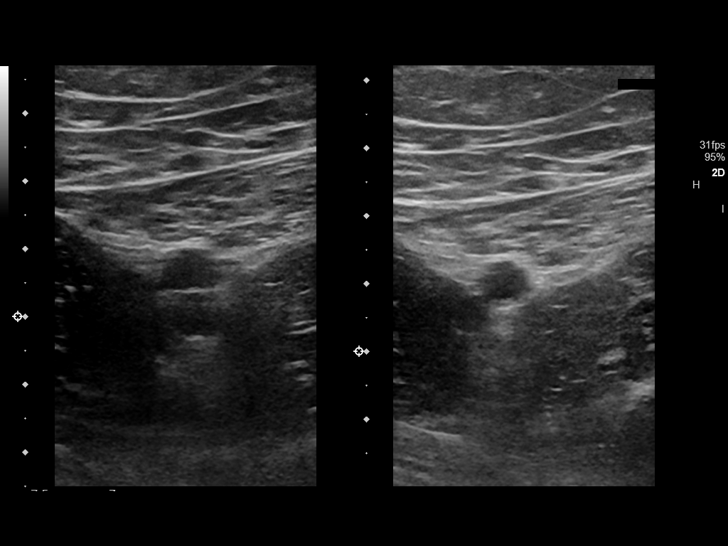
[im 18/34]
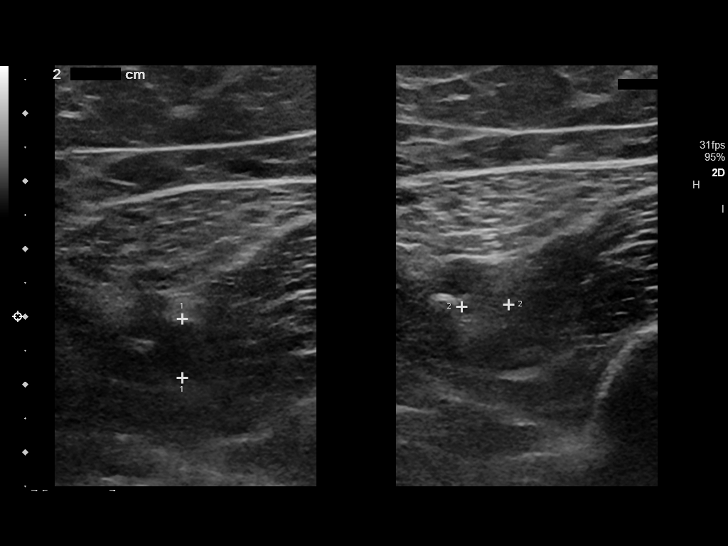
[im 19/34]
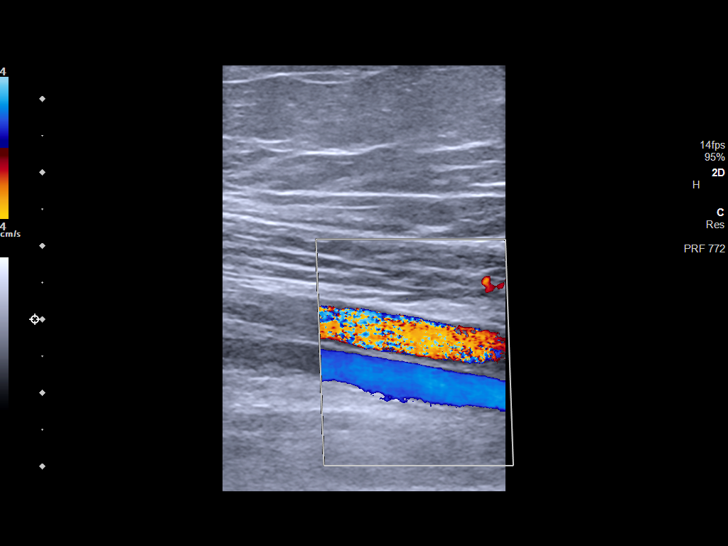
[im 22/34]
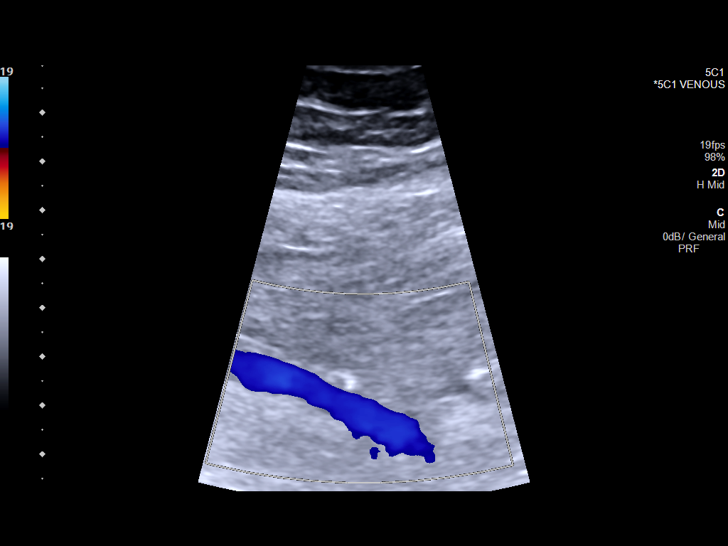
[im 25/34]
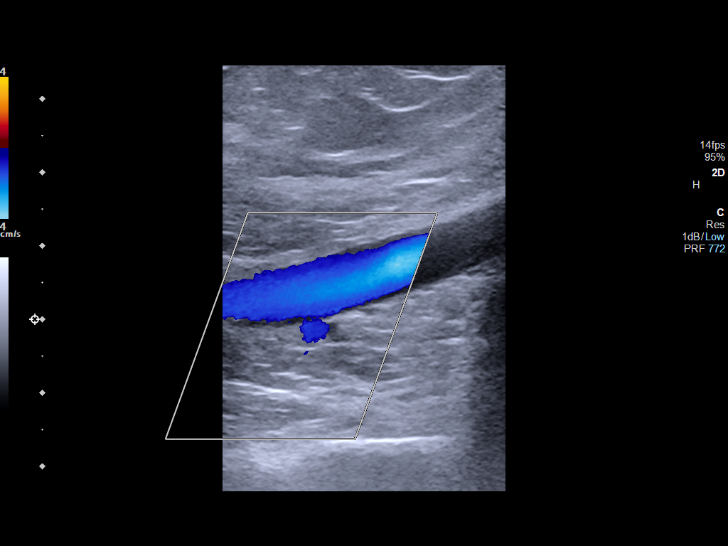
[im 28/34]
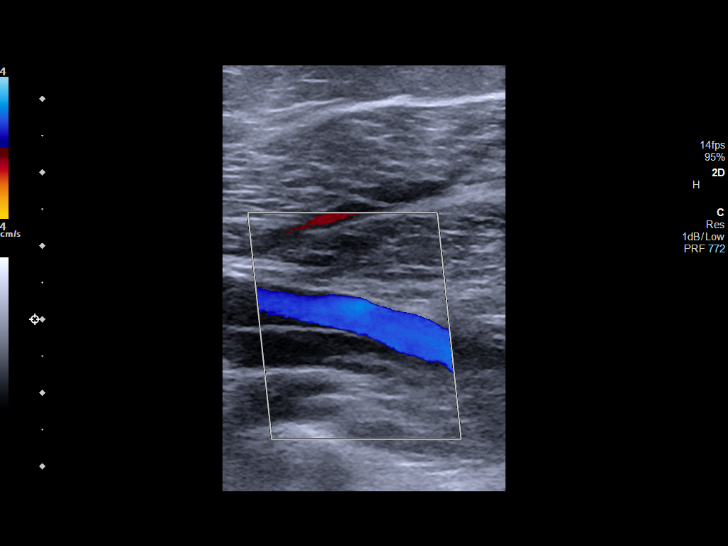
[im 31/34]
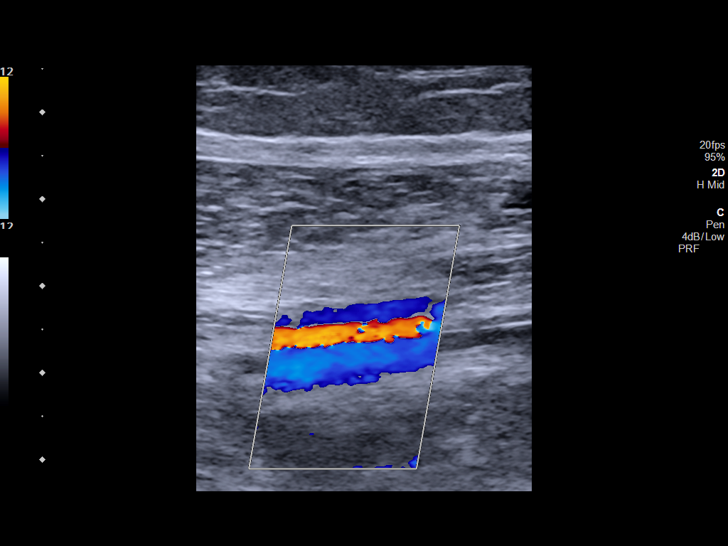
[im 34/34]
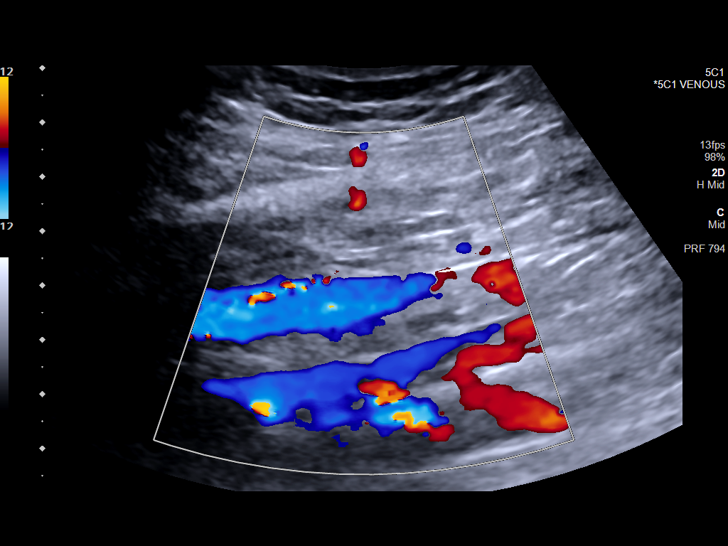

[13 of 24 positions shown; findings below may reference images not displayed]

FINDINGS: Contralateral Common Femoral Vein: Respiratory phasicity is normal
and symmetric with the symptomatic side. No evidence of thrombus.
Normal compressibility.

Common Femoral Vein: No evidence of thrombus. Normal
compressibility, respiratory phasicity and response to augmentation.

Saphenofemoral Junction: No evidence of thrombus. Normal
compressibility and flow on color Doppler imaging.

Profunda Femoral Vein: No evidence of thrombus. Normal
compressibility and flow on color Doppler imaging.

Femoral Vein: No evidence of thrombus. Normal compressibility,
respiratory phasicity and response to augmentation.

Popliteal Vein: No evidence of thrombus. Normal compressibility,
respiratory phasicity and response to augmentation.

Calf Veins: No evidence of thrombus. Normal compressibility and flow
on color Doppler imaging.

Superficial Great Saphenous Vein: No evidence of thrombus. Normal
compressibility.

Venous Reflux:  None.

Other Findings:  None.
IMPRESSION: No evidence of deep venous thrombosis.

## 2020-08-08 ENCOUNTER — Ambulatory Visit (INDEPENDENT_AMBULATORY_CARE_PROVIDER_SITE_OTHER): Payer: BC Managed Care – PPO | Admitting: Dermatology

## 2020-08-08 ENCOUNTER — Ambulatory Visit: Payer: BC Managed Care – PPO | Admitting: Dermatology

## 2020-08-08 ENCOUNTER — Encounter: Payer: Self-pay | Admitting: Dermatology

## 2020-08-08 ENCOUNTER — Other Ambulatory Visit: Payer: Self-pay

## 2020-08-08 DIAGNOSIS — D235 Other benign neoplasm of skin of trunk: Secondary | ICD-10-CM | POA: Diagnosis not present

## 2020-08-08 DIAGNOSIS — L578 Other skin changes due to chronic exposure to nonionizing radiation: Secondary | ICD-10-CM

## 2020-08-08 DIAGNOSIS — D492 Neoplasm of unspecified behavior of bone, soft tissue, and skin: Secondary | ICD-10-CM

## 2020-08-08 DIAGNOSIS — D239 Other benign neoplasm of skin, unspecified: Secondary | ICD-10-CM

## 2020-08-08 DIAGNOSIS — D485 Neoplasm of uncertain behavior of skin: Secondary | ICD-10-CM

## 2020-08-08 DIAGNOSIS — Z808 Family history of malignant neoplasm of other organs or systems: Secondary | ICD-10-CM | POA: Diagnosis not present

## 2020-08-08 MED ORDER — MUPIROCIN 2 % EX OINT: 1.0000 "application " | TOPICAL_OINTMENT | Freq: Every day | CUTANEOUS | 0 refills | Status: DC

## 2020-08-08 NOTE — Progress Notes (Signed)
Follow-Up Visit   Subjective  Bethany Aguilar is a 29 y.o. female who presents for the following: Dysplastic nevus bx proven (L mid back, 6.0cm lat to spine, pt preseents for excision).  The following portions of the chart were reviewed this encounter and updated as appropriate:  Tobacco  Allergies  Meds  Problems  Med Hx  Surg Hx  Fam Hx     Review of Systems:  No other skin or systemic complaints except as noted in HPI or Assessment and Plan.  Objective  Well appearing patient in no apparent distress; mood and affect are within normal limits.  A focused examination was performed including back. Relevant physical exam findings are noted in the Assessment and Plan.  Objective  Left mid back, 6.0cm lat to spine: Pink bx site 1.0 x 0.5cm  Objective  L scapula, R ant axillary fold, R sup medial scapula, R lower back: Pink bx sites   Assessment & Plan    Actinic Damage - chronic, secondary to cumulative UV radiation exposure/sun exposure over time - diffuse scaly erythematous macules with underlying dyspigmentation - Recommend daily broad spectrum sunscreen SPF 30+ to sun-exposed areas, reapply every 2 hours as needed.  - Call for new or changing lesions.  Family history of Melanoma - Maternal grandfather  Neoplasm of skin Left mid back, 6.0cm lat to spine  Skin excision  Lesion length (cm):  1 Lesion width (cm):  0.5 Margin per side (cm):  0.2 Total excision diameter (cm):  1.4 Informed consent: discussed and consent obtained   Timeout: patient name, date of birth, surgical site, and procedure verified   Procedure prep:  Patient was prepped and draped in usual sterile fashion Prep type:  Isopropyl alcohol and povidone-iodine Anesthesia: the lesion was anesthetized in a standard fashion   Anesthetic:  1% lidocaine w/ epinephrine 1-100,000 buffered w/ 8.4% NaHCO3 (10cc) Instrument used: #15 blade   Hemostasis achieved with: pressure   Hemostasis achieved  with comment:  Electrocautery Outcome: patient tolerated procedure well with no complications   Post-procedure details: sterile dressing applied and wound care instructions given   Dressing type: bandage and pressure dressing (Mupirocin)    Skin repair Complexity:  Complex Final length (cm):  3 Reason for type of repair: reduce tension to allow closure, reduce the risk of dehiscence, infection, and necrosis, reduce subcutaneous dead space and avoid a hematoma, allow closure of the large defect, preserve normal anatomy, preserve normal anatomical and functional relationships and enhance both functionality and cosmetic results   Undermining: area extensively undermined   Undermining comment:  Undermining Defect 1.0cm Subcutaneous layers (deep stitches):  Suture size:  2-0 Suture type: Vicryl (polyglactin 910)   Subcutaneous suture technique: Inverted Dermal. Fine/surface layer approximation (top stitches):  Suture size:  3-0 Suture type: nylon   Stitches: simple running   Suture removal (days):  7 Hemostasis achieved with: pressure Outcome: patient tolerated procedure well with no complications   Post-procedure details: sterile dressing applied and wound care instructions given   Dressing type: bandage, pressure dressing and bacitracin (Mupirocin)    mupirocin ointment (BACTROBAN) 2 %  Specimen 1 - Surgical pathology Differential Diagnosis: D48.5 Bx proven Severe dysplastic nevus Check Margins: yes Pink bx site 1.0 x 0.5cm [id number]  Severe dysplastic nevus bx proven, excised today  Start Mupirocin oint qd with dressing changes  Dysplastic nevus L scapula, R ant axillary fold, R sup medial scapula, R lower back  Bx proven  Will plan shave removal on the  moderate to severe dysplastic nevi of the L scapula, and R sup medial scapula on f/u  Will observe other dysplastic nevi of the R ant axillary fold and R lower back  Return in about 1 week (around 08/15/2020) for suture  removal, and shave removal Dysplastic Nevi of the L scapula and R sup medial scapula.  I, Othelia Pulling, RMA, am acting as scribe for Sarina Ser, MD .  Documentation: I have reviewed the above documentation for accuracy and completeness, and I agree with the above.  Sarina Ser, MD

## 2020-08-08 NOTE — Patient Instructions (Signed)

## 2020-08-09 ENCOUNTER — Telehealth: Payer: Self-pay

## 2020-08-09 NOTE — Telephone Encounter (Signed)
Patient doing well after surgery with no complaints other than tenderness.

## 2020-08-15 ENCOUNTER — Ambulatory Visit (INDEPENDENT_AMBULATORY_CARE_PROVIDER_SITE_OTHER): Payer: BC Managed Care – PPO | Admitting: Dermatology

## 2020-08-15 ENCOUNTER — Other Ambulatory Visit: Payer: Self-pay

## 2020-08-15 DIAGNOSIS — D225 Melanocytic nevi of trunk: Secondary | ICD-10-CM

## 2020-08-15 DIAGNOSIS — D485 Neoplasm of uncertain behavior of skin: Secondary | ICD-10-CM

## 2020-08-15 DIAGNOSIS — D235 Other benign neoplasm of skin of trunk: Secondary | ICD-10-CM

## 2020-08-15 DIAGNOSIS — D492 Neoplasm of unspecified behavior of bone, soft tissue, and skin: Secondary | ICD-10-CM

## 2020-08-15 DIAGNOSIS — D239 Other benign neoplasm of skin, unspecified: Secondary | ICD-10-CM

## 2020-08-15 DIAGNOSIS — Z4802 Encounter for removal of sutures: Secondary | ICD-10-CM

## 2020-08-15 NOTE — Patient Instructions (Signed)

## 2020-08-15 NOTE — Progress Notes (Signed)
Follow-Up Visit   Subjective  Bethany Aguilar is a 29 y.o. 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  The following portions of the chart were reviewed this encounter and updated as appropriate:  Tobacco  Allergies  Meds  Problems  Med Hx  Surg Hx  Fam Hx     Review of Systems:  No other skin or systemic complaints except as noted in HPI or Assessment and Plan.  Objective  Well appearing patient in no apparent distress; mood and affect are within normal limits.  A focused examination was performed including back. Relevant physical exam findings are noted in the Assessment and Plan.  Objective  Left scapula: Pink bx site 1.2cm  Objective  Right superior medial scapula: Pink bx site 1.2cm  Objective  Left mid back 6.0cm lat to spine: Healing excision site   Assessment & Plan    Actinic Damage - chronic, secondary to cumulative UV radiation exposure/sun exposure over time - diffuse scaly erythematous macules with underlying dyspigmentation - Recommend daily broad spectrum sunscreen SPF 30+ to sun-exposed areas, reapply every 2 hours as needed.  - Call for new or changing lesions.  History of Dysplastic Nevi - No evidence of recurrence today - Recommend regular full body skin exams - Recommend daily broad spectrum sunscreen SPF 30+ to sun-exposed areas, reapply every 2 hours as needed.  - Call if any new or changing lesions are noted between office visits  Neoplasm of skin (2) Left scapula  Epidermal / dermal shaving  Lesion diameter (cm):  1.2 Informed consent: discussed and consent obtained   Timeout: patient name, date of birth, surgical site, and procedure verified   Procedure prep:  Patient was prepped and draped in usual sterile  fashion Prep type:  Isopropyl alcohol Anesthesia: the lesion was anesthetized in a standard fashion   Anesthetic:  1% lidocaine w/ epinephrine 1-100,000 buffered w/ 8.4% NaHCO3 Instrument used: flexible razor blade   Hemostasis achieved with: pressure, aluminum chloride and electrodesiccation   Outcome: patient tolerated procedure well   Post-procedure details: sterile dressing applied and wound care instructions given   Dressing type: bandage and petrolatum    Specimen 1 - Surgical pathology Differential Diagnosis: D48.5 bx proven Dysplastic nevus Check Margins: yes Pink bx site 1.2cm [id number]   Right superior medial scapula  Epidermal / dermal shaving  Lesion diameter (cm):  1.2 Informed consent: discussed and consent obtained   Timeout: patient name, date of birth, surgical site, and procedure verified   Procedure prep:  Patient was prepped and draped in usual sterile fashion Prep type:  Isopropyl alcohol Anesthesia: the lesion was anesthetized in a standard fashion   Anesthetic:  1% lidocaine w/ epinephrine 1-100,000 buffered w/ 8.4% NaHCO3 Instrument used: flexible razor blade   Hemostasis achieved with: pressure, aluminum chloride and electrodesiccation   Outcome: patient tolerated procedure well   Post-procedure details: sterile dressing applied and wound care instructions given   Dressing type: bandage and petrolatum    Specimen 2 - Surgical pathology Differential Diagnosis: D48.5 Bx proven dysplastic nevus Check Margins: yes Pink bx site 1.2cm [id number]  Bx proven moderate to severe dysplastic nevi, shave removal/bx today  Other Related Medications mupirocin ointment (BACTROBAN) 2 %  Dysplastic nevus Left mid back 6.0cm lat to spine  Severe, margins free, bx proven  Healing excision site  Wound cleansed, sutures removed, wound cleansed and steri strips applied. Discussed pathology results.   Return for as scheduled for 94m f/u.  I, Othelia Pulling, RMA, am acting as scribe for Sarina Ser, MD .  Documentation: I have reviewed the above documentation for accuracy and completeness, and I agree with the above.  Sarina Ser, MD

## 2020-08-16 ENCOUNTER — Encounter: Payer: Self-pay | Admitting: Dermatology

## 2020-08-17 ENCOUNTER — Encounter: Payer: Self-pay | Admitting: Dermatology

## 2020-08-17 ENCOUNTER — Telehealth: Payer: Self-pay

## 2020-08-17 NOTE — Telephone Encounter (Signed)
Patient informed of pathology results 

## 2020-08-17 NOTE — Telephone Encounter (Signed)
-----   Message from Ralene Bathe, MD sent at 08/16/2020  6:00 PM EST ----- Diagnosis 1. Skin , left scapula RESIDUAL DYSPLASTIC NEVUS, MARGIN FREE 2. Skin , right superior medial scapula NO RESIDUAL DYSPLASTIC NEVUS, MARGINS FREE  1&2 - both residual dysplastic Margins free Recheck next visit

## 2020-11-29 ENCOUNTER — Telehealth: Payer: Self-pay

## 2020-11-29 NOTE — Telephone Encounter (Signed)
Patient returned call and is ok to wait to get rx at appointment tomorrow.

## 2020-11-29 NOTE — Telephone Encounter (Signed)
Patient was informed by pharmacy apt needed for refill of Phexxi. Patient is scheduled for AE tomorrow w/Kate Veal. Requesting refill of Phexxi. [phone]

## 2020-11-29 NOTE — Telephone Encounter (Signed)
LMVM to notify we do usually fill birth control until next apt. Given her apt is tomorrow, she will get a full year rx at her apt. Patient advised to return call if she needs this today.

## 2020-11-30 ENCOUNTER — Encounter: Payer: Self-pay | Admitting: Obstetrics and Gynecology

## 2020-11-30 ENCOUNTER — Other Ambulatory Visit: Payer: Self-pay

## 2020-11-30 ENCOUNTER — Ambulatory Visit (INDEPENDENT_AMBULATORY_CARE_PROVIDER_SITE_OTHER): Payer: BC Managed Care – PPO | Admitting: Obstetrics and Gynecology

## 2020-11-30 VITALS — BP 112/72 | Ht 65.0 in | Wt 185.0 lb

## 2020-11-30 DIAGNOSIS — N898 Other specified noninflammatory disorders of vagina: Secondary | ICD-10-CM

## 2020-11-30 DIAGNOSIS — Z0000 Encounter for general adult medical examination without abnormal findings: Secondary | ICD-10-CM | POA: Diagnosis not present

## 2020-11-30 DIAGNOSIS — Z3040 Encounter for surveillance of contraceptives, unspecified: Secondary | ICD-10-CM

## 2020-11-30 DIAGNOSIS — B3731 Acute candidiasis of vulva and vagina: Secondary | ICD-10-CM

## 2020-11-30 DIAGNOSIS — Z7185 Encounter for immunization safety counseling: Secondary | ICD-10-CM | POA: Diagnosis not present

## 2020-11-30 DIAGNOSIS — Z01419 Encounter for gynecological examination (general) (routine) without abnormal findings: Secondary | ICD-10-CM | POA: Diagnosis not present

## 2020-11-30 DIAGNOSIS — B373 Candidiasis of vulva and vagina: Secondary | ICD-10-CM

## 2020-11-30 LAB — POCT WET PREP (WET MOUNT)
Clue Cells Wet Prep Whiff POC: NEGATIVE
Trichomonas Wet Prep HPF POC: ABSENT

## 2020-11-30 MED ORDER — FLUCONAZOLE 150 MG PO TABS: 150.0000 mg | ORAL_TABLET | Freq: Once | ORAL | 0 refills | Status: AC

## 2020-11-30 MED ORDER — PHEXXI 1.8-1-0.4 % VA GEL: 1.0000 | Freq: Every day | VAGINAL | 11 refills | Status: DC | PRN

## 2020-11-30 NOTE — Progress Notes (Signed)
Gynecology Annual Exam   PCP: Patient, No Pcp Per  Chief Complaint:  Chief Complaint  Patient presents with  . Gynecologic Exam    Annual - no concerns. RM 5    History of Present Illness: Patient is a 30 y.o. G0P0000 presents for annual exam. The patient has no complaints today. She does report a change in vaginal discharge over the last two months, noted a whiter, thicker discharge. She denies discharge odor.  LMP: Patient's last menstrual period was 11/14/2020. Average Interval: regular, 28 days Duration of flow: 7 days Heavy Menses: no Clots: no Intermenstrual Bleeding: no Postcoital Bleeding: no Dysmenorrhea: no  The patient is sexually active. She currently uses vaginal spermicide for contraception. She denies dyspareunia.  There is no notable family history of breast or ovarian cancer in her family.  The patient wears seatbelts: yes.   The patient has regular exercise: yes - patient reports going to the gym 2-3 times a week, lifting weight and cardio,   The patient reports current symptoms of depression.   PQH-9: 5 GAD-7: 2  Review of Systems: Review of Systems  Constitutional: Positive for malaise/fatigue.  HENT: Negative.   Eyes: Negative.   Respiratory: Negative.   Cardiovascular: Negative.   Gastrointestinal: Negative.   Genitourinary: Negative.   Musculoskeletal: Negative.   Skin: Negative.   Neurological: Positive for headaches.  Endo/Heme/Allergies: Negative.   Psychiatric/Behavioral: Positive for depression. Negative for suicidal ideas. The patient is not nervous/anxious.     Past Medical History:  Patient Active Problem List   Diagnosis Date Noted  . Contraceptive management 10/03/2017    Past Surgical History:  Past Surgical History:  Procedure Laterality Date  . SKIN BIOPSY     back, stomach and leg; atypical cells    Gynecologic History:  Patient's last menstrual period was 11/14/2020. Contraception: vaginal spermicide Last Pap:  10/2018 Results were: NILM   Obstetric History: G0P0000  Family History:  Family History  Problem Relation Age of Onset  . Skin cancer Paternal Grandmother   . Diabetes Maternal Aunt   . Throat cancer Maternal Aunt 60  . Diabetes Maternal Grandfather   . Skin cancer Maternal Grandfather   . Breast cancer Neg Hx   . Ovarian cancer Neg Hx     Social History:  Social History   Socioeconomic History  . Marital status: Married    Spouse name: Dominica Severin  . Number of children: 0  . Years of education: 71  . Highest education level: Not on file  Occupational History  . 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 Smoking status: Never Smoker  . Smokeless tobacco: Never Used  Vaping Use  . Vaping Use: Never used  Substance and Sexual Activity  . Alcohol use: Yes    Comment: 1-2/month  . Drug use: No  . Sexual activity: Yes    Partners: Male    Birth control/protection: Pill  Other Topics Concern  . Not on file  Social History Narrative  . Not on file   Social Determinants of Health   Financial Resource Strain: Not on file  Food Insecurity: Not on file  Transportation Needs: Not on file  Physical Activity: Not on file  Stress: Not on file  Social Connections: Not on file  Intimate Partner Violence: Not on file    Allergies:  No Known Allergies  Medications: Prior to Admission medications   Medication Sig Start Date End Date Taking? Authorizing Provider  Adapalene (DIFFERIN) 0.3 % gel  Apply 1 application topically at bedtime. 06/07/20  Yes Ralene Bathe, MD  doxycycline (VIBRAMYCIN) 50 MG capsule Take 1 capsule (50 mg total) by mouth daily. With food and plenty of fluid 06/07/20  Yes Ralene Bathe, MD  fluconazole (DIFLUCAN) 150 MG tablet Take 1 tablet (150 mg total) by mouth once for 1 dose. May repeat in 3 days if symptoms persist 11/30/20 11/30/20 Yes Omolara Carol, CNM  Lactic Ac-Citric Ac-Pot Bitart (PHEXXI) 1.8-1-0.4 % GEL Place 1 Applicatorful vaginally  daily as needed. Within 1 hour prior to  having intercourse 11/30/20   Orlie Pollen, CNM    Physical Exam Vitals: Blood pressure 112/72, height 5\' 5"  (1.651 m), weight 185 lb (83.9 kg), last menstrual period 11/14/2020.  General: NAD HEENT: normocephalic, anicteric Thyroid: no enlargement, no palpable nodules Pulmonary: No increased work of breathing, CTAB Cardiovascular: RRR, distal pulses 2+ Breast: Breast symmetrical, no tenderness, no palpable nodules or masses, no skin or nipple retraction present, no nipple discharge.  No axillary or supraclavicular lymphadenopathy. Abdomen: NABS, soft, non-tender, non-distended.  Umbilicus without lesions.  No hepatomegaly, splenomegaly or masses palpable. No evidence of hernia  Genitourinary:  External: Normal external female genitalia.  Normal urethral meatus, normal Bartholin's and Skene's glands.   Vagina: Normal vaginal mucosa, no evidence of prolapse. Thick, white, adherent discharge noted on exam    Cervix: Grossly normal in appearance, no bleeding  Uterus: Non-enlarged, mobile, normal contour.  No CMT  Deferred bimanual Extremities: no edema, erythema, or tenderness Neurologic: Grossly intact Psychiatric: mood appropriate, affect full    POCT Wet Prep [facility] [place])     Status: Abnormal   Collection Time: 11/30/20  4:16 PM  Result Value Ref Range   Source Wet Prep POC     WBC, Wet Prep HPF POC     Bacteria Wet Prep HPF POC Few Few   BACTERIA WET PREP MORPHOLOGY POC     Clue Cells Wet Prep HPF POC None None   Clue Cells Wet Prep Whiff POC Negative Whiff    Yeast Wet Prep HPF POC Moderate (A) None   KOH Wet Prep POC Moderate (A) None   Trichomonas Wet Prep HPF POC Absent Absent     Assessment: 30 y.o. G0P0000 routine annual exam with vaginal candida.  Plan: Problem List Items Addressed This Visit      Other   Contraceptive management - Primary   Relevant Medications   Lactic Ac-Citric Ac-Pot Bitart (PHEXXI) 1.8-1-0.4 % GEL     Other Visit Diagnoses    Immunization counseling       Routine health maintenance       Encounter for gynecological examination without abnormal finding       Vaginal candida       Relevant Medications   fluconazole (DIFLUCAN) 150 MG tablet   Vaginal discharge       Relevant Orders   POCT Wet Prep [facility]) (Completed)      1) STI screening  was offered and declined  2)  ASCCP guidelines and rational discussed.  Patient opts for every 3 years screening interval  3) Contraception - the patient is currently using  vaginal spermicide.  She is happy with her current form of contraception and plans to continue  4) Diflucan for vaginal candida noted on wet mount  5) Return in about 1 year (around 11/30/2021).   Orlie Pollen, CNM, MSN [facility] 11/30/2020, 4:22 PM

## 2020-12-06 ENCOUNTER — Ambulatory Visit: Payer: BC Managed Care – PPO | Admitting: Dermatology

## 2021-01-18 ENCOUNTER — Encounter: Payer: Self-pay | Admitting: Dermatology

## 2021-01-18 ENCOUNTER — Ambulatory Visit: Payer: BC Managed Care – PPO | Admitting: Dermatology

## 2021-01-18 ENCOUNTER — Other Ambulatory Visit: Payer: Self-pay

## 2021-01-18 DIAGNOSIS — D239 Other benign neoplasm of skin, unspecified: Secondary | ICD-10-CM

## 2021-01-18 DIAGNOSIS — D1800 Hemangioma unspecified site: Secondary | ICD-10-CM

## 2021-01-18 DIAGNOSIS — L578 Other skin changes due to chronic exposure to nonionizing radiation: Secondary | ICD-10-CM

## 2021-01-18 DIAGNOSIS — D229 Melanocytic nevi, unspecified: Secondary | ICD-10-CM

## 2021-01-18 DIAGNOSIS — D225 Melanocytic nevi of trunk: Secondary | ICD-10-CM | POA: Diagnosis not present

## 2021-01-18 DIAGNOSIS — L814 Other melanin hyperpigmentation: Secondary | ICD-10-CM | POA: Diagnosis not present

## 2021-01-18 DIAGNOSIS — Z1283 Encounter for screening for malignant neoplasm of skin: Secondary | ICD-10-CM | POA: Diagnosis not present

## 2021-01-18 DIAGNOSIS — L821 Other seborrheic keratosis: Secondary | ICD-10-CM

## 2021-01-18 DIAGNOSIS — L910 Hypertrophic scar: Secondary | ICD-10-CM | POA: Diagnosis not present

## 2021-01-18 DIAGNOSIS — Z86018 Personal history of other benign neoplasm: Secondary | ICD-10-CM

## 2021-01-18 NOTE — Progress Notes (Signed)
Follow-Up Visit   Subjective  Bethany Aguilar is a 30 y.o. female who presents for the following: Annual Exam (Hx dysplastic nevus - patient c/o pain and itching at previous dysplastic nevus site and would like to discuss treatment options. She has also noticed an area of re-pigmentation at the R sup med scapula which was previously a moderate to severely dysplastic nevus).  The following portions of the chart were reviewed this encounter and updated as appropriate:   Tobacco  Allergies  Meds  Problems  Med Hx  Surg Hx  Fam Hx     Review of Systems:  No other skin or systemic complaints except as noted in HPI or Assessment and Plan.  Objective  Well appearing patient in no apparent distress; mood and affect are within normal limits.  A full examination was performed including scalp, head, eyes, ears, nose, lips, neck, chest, axillae, abdomen, back, buttocks, bilateral upper extremities, bilateral lower extremities, hands, feet, fingers, toes, fingernails, and toenails. All findings within normal limits unless otherwise noted below.  Objective  L scapula: Thickened inflamed scar.  Objective  R sup med scapula: 0.5 cm re-pigmented brown macule.   Assessment & Plan  Keloid L scapula At site of of previous dysplastic nevus - S/P shave removal  Intralesional injection - L scapula Location: L sup scapula  Informed Consent: Discussed risks (infection, pain, bleeding, bruising, thinning of the skin, loss of skin pigment, lack of resolution, and recurrence of lesion) and benefits of the procedure, as well as the alternatives. Informed consent was obtained. Preparation: The area was prepared a standard fashion.  Procedure Details: An intralesional injection was performed with Kenalog 10 mg/cc. 0.1 cc in total were injected.  Total number of injections: 3  Plan: The patient was instructed on post-op care. Recommend OTC analgesia as needed for pain.   Dysplastic nevus R  sup med scapula R sup medial scapula, moderate to severe atypia - shave removal on 08/15/2020  Epidermal / dermal shaving - R sup med scapula  Lesion diameter (cm):  0.5 Informed consent: discussed and consent obtained   Timeout: patient name, date of birth, surgical site, and procedure verified   Procedure prep:  Patient was prepped and draped in usual sterile fashion Prep type:  Isopropyl alcohol Anesthesia: the lesion was anesthetized in a standard fashion   Anesthetic:  1% lidocaine w/ epinephrine 1-100,000 buffered w/ 8.4% NaHCO3 Instrument used: flexible razor blade   Hemostasis achieved with: pressure, aluminum chloride and electrodesiccation   Outcome: patient tolerated procedure well   Post-procedure details: sterile dressing applied and wound care instructions given   Dressing type: bandage and petrolatum    Skin cancer screening  Lentigines - Scattered tan macules - Due to sun exposure - Benign-appering, observe - Recommend daily broad spectrum sunscreen SPF 30+ to sun-exposed areas, reapply every 2 hours as needed. - Call for any changes  Seborrheic Keratoses - Stuck-on, waxy, tan-brown papules and/or plaques  - Benign-appearing - Discussed benign etiology and prognosis. - Observe - Call for any changes  Melanocytic Nevi - Tan-brown and/or pink-flesh-colored symmetric macules and papules - Benign appearing on exam today - Observation - Call clinic for new or changing moles - Recommend daily use of broad spectrum spf 30+ sunscreen to sun-exposed areas.   Hemangiomas - Red papules - Discussed benign nature - Observe - Call for any changes  Actinic Damage - Chronic condition, secondary to cumulative UV/sun exposure - diffuse scaly erythematous macules with underlying dyspigmentation -  Recommend daily broad spectrum sunscreen SPF 30+ to sun-exposed areas, reapply every 2 hours as needed.  - Staying in the shade or wearing long sleeves, sun glasses (UVA+UVB  protection) and wide brim hats (4-inch brim around the entire circumference of the hat) are also recommended for sun protection.  - Call for new or changing lesions.  History of Dysplastic Nevi - No evidence of recurrence today - Recommend regular full body skin exams - Recommend daily broad spectrum sunscreen SPF 30+ to sun-exposed areas, reapply every 2 hours as needed.  - Call if any new or changing lesions are noted between office visits  Skin cancer screening performed today.  Return in about 6 months (around 07/21/2021) for TBSE.  Luther Redo, CMA, am acting as scribe for Sarina Ser, MD .  Documentation: I have reviewed the above documentation for accuracy and completeness, and I agree with the above.  Sarina Ser, MD

## 2021-01-18 NOTE — Patient Instructions (Signed)
If you have any questions or concerns for your doctor, please call our main line at [phone] and press option 4 to reach your doctor's medical assistant. If no one answers, please leave a voicemail as directed and we will return your call as soon as possible. Messages left after 4 pm will be answered the following business day.   You may also send us a message via MyChart. We typically respond to MyChart messages within 1-2 business days.  For prescription refills, please ask your pharmacy to contact our office. Our fax number is [phone].  If you have an urgent issue when the clinic is closed that cannot wait until the next business day, you can page your doctor at the number below.    Please note that while we do our best to be available for urgent issues outside of office hours, we are not available 24/7.   If you have an urgent issue and are unable to reach us, you may choose to seek medical care at your doctor's office, retail clinic, urgent care center, or emergency room.  If you have a medical emergency, please immediately call 911 or go to the emergency department.  Pager Numbers  - Dr. Kowalski: [phone]  - Dr. Moye: [phone]  - Dr. Stewart: [phone]  In the event of inclement weather, please call our main line at [phone] for an update on the status of any delays or closures.  Dermatology Medication Tips: Please keep the boxes that topical medications come in in order to help keep track of the instructions about where and how to use these. Pharmacies typically print the medication instructions only on the boxes and not directly on the medication tubes.   If your medication is too expensive, please contact our office at [phone] option 4 or send us a message through MyChart.   We are unable to tell what your co-pay for medications will be in advance as this is different depending on your insurance coverage. However, we may be able to find a  substitute medication at lower cost or fill out paperwork to get insurance to cover a needed medication.   If a prior authorization is required to get your medication covered by your insurance company, please allow us 1-2 business days to complete this process.  Drug prices often vary depending on where the prescription is filled and some pharmacies may offer cheaper prices.  The website www.goodrx.com contains coupons for medications through different pharmacies. The prices here do not account for what the cost may be with help from insurance (it may be cheaper with your insurance), but the website can give you the price if you did not use any insurance.  - You can print the associated coupon and take it with your prescription to the pharmacy.  - You may also stop by our office during regular business hours and pick up a GoodRx coupon card.  - If you need your prescription sent electronically to a different pharmacy, notify our office through [facility] MyChart or by phone at [phone] option 4.     Wound Care Instructions  1. Cleanse wound gently with soap and water once a day then pat dry with clean gauze. Apply a thing coat of Petrolatum (petroleum jelly, "Vaseline") over the wound (unless you have an allergy to this). We recommend that you use a new, sterile tube of Vaseline. Do not pick or remove scabs. Do not remove the yellow or white "healing tissue" from the base of the wound.    2. Cover the wound with fresh, clean, nonstick gauze and secure with paper tape. You may use Band-Aids in place of gauze and tape if the would is small enough, but would recommend trimming much of the tape off as there is often too much. Sometimes Band-Aids can irritate the skin.  3. You should call the office for your biopsy report after 1 week if you have not already been contacted.  4. If you experience any problems, such as abnormal amounts of bleeding, swelling, significant bruising, significant pain,  or evidence of infection, please call the office immediately.  5. FOR ADULT SURGERY PATIENTS: If you need something for pain relief you may take 1 extra strength Tylenol (acetaminophen) AND 2 Ibuprofen (200mg each) together every 4 hours as needed for pain. (do not take these if you are allergic to them or if you have a reason you should not take them.) Typically, you may only need pain medication for 1 to 3 days.     

## 2021-01-22 ENCOUNTER — Encounter: Payer: Self-pay | Admitting: Dermatology

## 2021-03-28 DIAGNOSIS — F4323 Adjustment disorder with mixed anxiety and depressed mood: Secondary | ICD-10-CM | POA: Diagnosis not present

## 2021-04-25 DIAGNOSIS — F4323 Adjustment disorder with mixed anxiety and depressed mood: Secondary | ICD-10-CM | POA: Diagnosis not present

## 2021-05-22 DIAGNOSIS — F4323 Adjustment disorder with mixed anxiety and depressed mood: Secondary | ICD-10-CM | POA: Diagnosis not present

## 2021-08-01 ENCOUNTER — Ambulatory Visit: Payer: BC Managed Care – PPO | Admitting: Dermatology

## 2021-08-01 ENCOUNTER — Other Ambulatory Visit: Payer: Self-pay

## 2021-08-01 DIAGNOSIS — L905 Scar conditions and fibrosis of skin: Secondary | ICD-10-CM

## 2021-08-01 DIAGNOSIS — D1800 Hemangioma unspecified site: Secondary | ICD-10-CM

## 2021-08-01 DIAGNOSIS — D492 Neoplasm of unspecified behavior of bone, soft tissue, and skin: Secondary | ICD-10-CM

## 2021-08-01 DIAGNOSIS — Z808 Family history of malignant neoplasm of other organs or systems: Secondary | ICD-10-CM

## 2021-08-01 DIAGNOSIS — D225 Melanocytic nevi of trunk: Secondary | ICD-10-CM

## 2021-08-01 DIAGNOSIS — Z1283 Encounter for screening for malignant neoplasm of skin: Secondary | ICD-10-CM

## 2021-08-01 DIAGNOSIS — Z86018 Personal history of other benign neoplasm: Secondary | ICD-10-CM

## 2021-08-01 DIAGNOSIS — L814 Other melanin hyperpigmentation: Secondary | ICD-10-CM

## 2021-08-01 DIAGNOSIS — L821 Other seborrheic keratosis: Secondary | ICD-10-CM

## 2021-08-01 DIAGNOSIS — D229 Melanocytic nevi, unspecified: Secondary | ICD-10-CM

## 2021-08-01 NOTE — Progress Notes (Signed)
Follow-Up Visit   Subjective  Bethany Aguilar is a 30 y.o. female who presents for the following: Annual Exam (Mole check ). Hx of Dysplastic nevus. Fx hx of Melanoma.  The patient has many many moles and has history of dysplastic nevi and is concerned about skin cancer and would like all these areas to be checked. The patient presents for Total-Body Skin Exam (TBSE) for skin cancer screening and mole check.    The following portions of the chart were reviewed this encounter and updated as appropriate:   Tobacco  Allergies  Meds  Problems  Med Hx  Surg Hx  Fam Hx      Review of Systems:  No other skin or systemic complaints except as noted in HPI or Assessment and Plan.  Objective  Well appearing patient in no apparent distress; mood and affect are within normal limits.  A full examination was performed including scalp, head, eyes, ears, nose, lips, neck, chest, axillae, abdomen, back, buttocks, bilateral upper extremities, bilateral lower extremities, hands, feet, fingers, toes, fingernails, and toenails. All findings within normal limits unless otherwise noted below.  right upper back paraspinal 0.6 cm irregular brown macule        left upper back medial to mid scapula 4 cm lateral to spine 1.1 cm irregular brown macule         Assessment & Plan  Neoplasm of skin (2) right upper back paraspinal  Epidermal / dermal shaving  Lesion diameter (cm):  0.6 Informed consent: discussed and consent obtained   Timeout: patient name, date of birth, surgical site, and procedure verified   Patient was prepped and draped in usual sterile fashion: area prepped with alcohol. Anesthesia: the lesion was anesthetized in a standard fashion   Anesthetic:  1% lidocaine w/ epinephrine 1-100,000 local infiltration Instrument used: flexible razor blade   Hemostasis achieved with: pressure, aluminum chloride and electrodesiccation   Outcome: patient tolerated procedure well    Post-procedure details: wound care instructions given   Post-procedure details comment:  Ointment and a small bandage applied  Specimen 1 - Surgical pathology Differential Diagnosis:  R/O dysplastic nevus   Check Margins: No  left upper back medial to mid scapula 4 cm lateral to spine  Epidermal / dermal shaving  Lesion diameter (cm):  1.1 Informed consent: discussed and consent obtained   Timeout: patient name, date of birth, surgical site, and procedure verified   Procedure prep:  Patient was prepped and draped in usual sterile fashion Prep type:  Isopropyl alcohol Anesthesia: the lesion was anesthetized in a standard fashion   Anesthetic:  1% lidocaine w/ epinephrine 1-100,000 buffered w/ 8.4% NaHCO3 Hemostasis achieved with: pressure, aluminum chloride and electrodesiccation   Outcome: patient tolerated procedure well   Post-procedure details: sterile dressing applied and wound care instructions given   Dressing type: bandage and petrolatum    Specimen 2 - Surgical pathology Differential Diagnosis: R/O Dysplastic nevus   Check Margins: No  Skin cancer screening  Lentigines - Scattered tan macules - Due to sun exposure - Benign-appearing, observe - Recommend daily broad spectrum sunscreen SPF 30+ to sun-exposed areas, reapply every 2 hours as needed. - Call for any changes  Seborrheic Keratoses - Stuck-on, waxy, tan-brown papules and/or plaques  - Benign-appearing - Discussed benign etiology and prognosis. - Observe - Call for any changes  Melanocytic Nevi - Tan-brown and/or pink-flesh-colored symmetric macules and papules - Benign appearing on exam today - Observation - Call clinic for new or changing  moles - Recommend daily use of broad spectrum spf 30+ sunscreen to sun-exposed areas.   Hemangiomas - Red papules - Discussed benign nature - Observe - Call for any changes  Staying in the shade or wearing long sleeves, sun glasses (UVA+UVB protection) and  wide brim hats (4-inch brim around the entire circumference of the hat) are also recommended for sun protection.  - Call for new or changing lesions.  History of Dysplastic Nevi Multiple see history  - No evidence of recurrence today - Recommend regular full body skin exams - Recommend daily broad spectrum sunscreen SPF 30+ to sun-exposed areas, reapply every 2 hours as needed.  - Call if any new or changing lesions are noted between office visits   Many irregular brown macules on the on the right thigh, right chest, abdomen, back and right lateral neck  ~15 areas need to be removed and sent for biopsy. Pt would like to remove only 2 areas today   Skin cancer screening performed today.   Return in about 6 months (around 01/29/2022) for TBSE, hx of Dysplastic nevus .  IMarye Round, CMA, am acting as scribe for Sarina Ser, MD .  Documentation: I have reviewed the above documentation for accuracy and completeness, and I agree with the above.  Sarina Ser, MD

## 2021-08-01 NOTE — Patient Instructions (Addendum)

## 2021-08-07 ENCOUNTER — Telehealth: Payer: Self-pay

## 2021-08-07 NOTE — Telephone Encounter (Signed)
Left message for patient to call office for results/hd 

## 2021-08-07 NOTE — Telephone Encounter (Signed)
Advised patient of results/hd  

## 2021-08-07 NOTE — Telephone Encounter (Signed)
-----   Message from Ralene Bathe, MD sent at 08/04/2021  2:50 PM EST ----- Diagnosis 1. Skin , right upper back paraspinal DYSPLASTIC COMPOUND NEVUS WITH MODERATE ATYPIA WITH SCAR AND PERSISTENT NEVUS-LIKE CHANGES, DEEP MARGIN INVOLVED, SEE DESCRIPTION 2. Skin , left upper back medial to mid scapula 4 cm lateral to spine DYSPLASTIC NEVUS WITH MODERATE TO SEVERE ATYPIA, DEEP MARGIN INVOLVED, SEE DESCRIPTION  1- Moderate dysplastic Recheck next visit 2- Moderate to severe dysplastic May need additional procedure Recheck next visit; sooner if desired Keep May 2023 appt

## 2021-08-14 ENCOUNTER — Encounter: Payer: Self-pay | Admitting: Dermatology

## 2021-10-31 ENCOUNTER — Ambulatory Visit: Payer: BC Managed Care – PPO | Admitting: Dermatology

## 2022-01-02 ENCOUNTER — Ambulatory Visit: Payer: BC Managed Care – PPO | Admitting: Dermatology

## 2022-01-02 DIAGNOSIS — L738 Other specified follicular disorders: Secondary | ICD-10-CM

## 2022-01-02 DIAGNOSIS — D2272 Melanocytic nevi of left lower limb, including hip: Secondary | ICD-10-CM

## 2022-01-02 DIAGNOSIS — D485 Neoplasm of uncertain behavior of skin: Secondary | ICD-10-CM

## 2022-01-02 NOTE — Progress Notes (Signed)
? ?  Follow-Up Visit ?  ?Subjective  ?Bethany Aguilar is a 30 y.o. female who presents for the following: Other (Mole of left post thigh that hurts). ?The patient has spots, moles and lesions to be evaluated, some may be new or changing and the patient has concerns that these could be cancer. ? ?The following portions of the chart were reviewed this encounter and updated as appropriate:  ? Tobacco  Allergies  Meds  Problems  Med Hx  Surg Hx  Fam Hx   ?  ?Review of Systems:  No other skin or systemic complaints except as noted in HPI or Assessment and Plan. ? ?Objective  ?Well appearing patient in no apparent distress; mood and affect are within normal limits. ? ?A focused examination was performed including left leg. Relevant physical exam findings are noted in the Assessment and Plan. ? ?Left Thigh - Posterior ?0.6 x 0.4  cm irregular brown macule ? ? ?Assessment & Plan  ?Neoplasm of uncertain behavior of skin ?Left Thigh - Posterior ? ?Epidermal / dermal shaving ? ?Lesion diameter (cm):  0.6 ?Informed consent: discussed and consent obtained   ?Timeout: patient name, date of birth, surgical site, and procedure verified   ?Procedure prep:  Patient was prepped and draped in usual sterile fashion ?Prep type:  Isopropyl alcohol ?Anesthesia: the lesion was anesthetized in a standard fashion   ?Anesthetic:  1% lidocaine w/ epinephrine 1-100,000 buffered w/ 8.4% NaHCO3 ?Instrument used: flexible razor blade   ?Hemostasis achieved with: pressure, aluminum chloride and electrodesiccation   ?Outcome: patient tolerated procedure well   ?Post-procedure details: sterile dressing applied and wound care instructions given   ?Dressing type: bandage and petrolatum   ? ?Specimen 1 - Surgical pathology ?Differential Diagnosis: Irritated nevus R/O dysplasia ?Check Margins: No ?Small Specimen ? ? ?Return for Follow up as scheduled, TBSE. ? ?I, Ashok Cordia, CMA, am acting as scribe for Sarina Ser, MD . ?Documentation: I  have reviewed the above documentation for accuracy and completeness, and I agree with the above. ? ?Sarina Ser, MD ? ?

## 2022-01-02 NOTE — Patient Instructions (Signed)

## 2022-01-07 ENCOUNTER — Telehealth: Payer: Self-pay

## 2022-01-07 NOTE — Telephone Encounter (Signed)
-----   Message from Ralene Bathe, MD sent at 01/04/2022  5:30 PM EDT ----- ?Diagnosis ?Skin , left thigh-posterior ?SUPPURATIVE FOLLICULITIS AND MELANOCYTIC NEVUS, INTRADERMAL TYPE ? ?Benign inflamed Mole with associated "pimple" ?No further treatment needed ?

## 2022-01-07 NOTE — Telephone Encounter (Signed)
Left pt message advising of bx results./sh 

## 2022-01-11 ENCOUNTER — Encounter: Payer: Self-pay | Admitting: Dermatology

## 2022-01-22 DIAGNOSIS — L237 Allergic contact dermatitis due to plants, except food: Secondary | ICD-10-CM | POA: Diagnosis not present

## 2022-02-07 ENCOUNTER — Ambulatory Visit: Payer: BC Managed Care – PPO | Admitting: Dermatology

## 2022-03-12 ENCOUNTER — Ambulatory Visit: Payer: BC Managed Care – PPO | Admitting: Dermatology

## 2022-12-09 ENCOUNTER — Ambulatory Visit (INDEPENDENT_AMBULATORY_CARE_PROVIDER_SITE_OTHER): Payer: BC Managed Care – PPO | Admitting: Dermatology

## 2022-12-09 DIAGNOSIS — D225 Melanocytic nevi of trunk: Secondary | ICD-10-CM

## 2022-12-09 DIAGNOSIS — Z79899 Other long term (current) drug therapy: Secondary | ICD-10-CM

## 2022-12-09 DIAGNOSIS — Z86018 Personal history of other benign neoplasm: Secondary | ICD-10-CM | POA: Diagnosis not present

## 2022-12-09 DIAGNOSIS — L578 Other skin changes due to chronic exposure to nonionizing radiation: Secondary | ICD-10-CM | POA: Diagnosis not present

## 2022-12-09 DIAGNOSIS — L858 Other specified epidermal thickening: Secondary | ICD-10-CM

## 2022-12-09 DIAGNOSIS — Z1283 Encounter for screening for malignant neoplasm of skin: Secondary | ICD-10-CM | POA: Diagnosis not present

## 2022-12-09 DIAGNOSIS — L814 Other melanin hyperpigmentation: Secondary | ICD-10-CM

## 2022-12-09 DIAGNOSIS — D485 Neoplasm of uncertain behavior of skin: Secondary | ICD-10-CM

## 2022-12-09 DIAGNOSIS — L700 Acne vulgaris: Secondary | ICD-10-CM

## 2022-12-09 DIAGNOSIS — L821 Other seborrheic keratosis: Secondary | ICD-10-CM

## 2022-12-09 DIAGNOSIS — D229 Melanocytic nevi, unspecified: Secondary | ICD-10-CM

## 2022-12-09 MED ORDER — ADAPALENE 0.3 % EX GEL: CUTANEOUS | 6 refills | Status: DC

## 2022-12-09 MED ORDER — DOXYCYCLINE HYCLATE 50 MG PO CAPS: 50.0000 mg | ORAL_CAPSULE | Freq: Every day | ORAL | 6 refills | Status: DC

## 2022-12-09 NOTE — Patient Instructions (Addendum)
Wound Care Instructions  Cleanse wound gently with soap and water once a day then pat dry with clean gauze. Apply a thin coat of Petrolatum (petroleum jelly, "Vaseline") over the wound (unless you have an allergy to this). We recommend that you use a new, sterile tube of Vaseline. Do not pick or remove scabs. Do not remove the yellow or white "healing tissue" from the base of the wound.  Cover the wound with fresh, clean, nonstick gauze and secure with paper tape. You may use Band-Aids in place of gauze and tape if the wound is small enough, but would recommend trimming much of the tape off as there is often too much. Sometimes Band-Aids can irritate the skin.  You should call the office for your biopsy report after 1 week if you have not already been contacted.  If you experience any problems, such as abnormal amounts of bleeding, swelling, significant bruising, significant pain, or evidence of infection, please call the office immediately.  FOR ADULT SURGERY PATIENTS: If you need something for pain relief you may take 1 extra strength Tylenol (acetaminophen) AND 2 Ibuprofen (200mg each) together every 4 hours as needed for pain. (do not take these if you are allergic to them or if you have a reason you should not take them.) Typically, you may only need pain medication for 1 to 3 days.     Due to recent changes in healthcare laws, you may see results of your pathology and/or laboratory studies on MyChart before the doctors have had a chance to review them. We understand that in some cases there may be results that are confusing or concerning to you. Please understand that not all results are received at the same time and often the doctors may need to interpret multiple results in order to provide you with the best plan of care or course of treatment. Therefore, we ask that you please give us 2 business days to thoroughly review all your results before contacting the office for clarification. Should  we see a critical lab result, you will be contacted sooner.   If You Need Anything After Your Visit  If you have any questions or concerns for your doctor, please call our main line at [phone] and press option 4 to reach your doctor's medical assistant. If no one answers, please leave a voicemail as directed and we will return your call as soon as possible. Messages left after 4 pm will be answered the following business day.   You may also send us a message via MyChart. We typically respond to MyChart messages within 1-2 business days.  For prescription refills, please ask your pharmacy to contact our office. Our fax number is [phone].  If you have an urgent issue when the clinic is closed that cannot wait until the next business day, you can page your doctor at the number below.    Please note that while we do our best to be available for urgent issues outside of office hours, we are not available 24/7.   If you have an urgent issue and are unable to reach us, you may choose to seek medical care at your doctor's office, retail clinic, urgent care center, or emergency room.  If you have a medical emergency, please immediately call 911 or go to the emergency department.  Pager Numbers  - Dr. Kowalski: [phone]  - Dr. Moye: [phone]  - Dr. Stewart: [phone]  In the event of inclement weather, please call our main line at   [phone] for an update on the status of any delays or closures.  Dermatology Medication Tips: Please keep the boxes that topical medications come in in order to help keep track of the instructions about where and how to use these. Pharmacies typically print the medication instructions only on the boxes and not directly on the medication tubes.   If your medication is too expensive, please contact our office at [phone] option 4 or send us a message through MyChart.   We are unable to tell what your co-pay for medications will be in  advance as this is different depending on your insurance coverage. However, we may be able to find a substitute medication at lower cost or fill out paperwork to get insurance to cover a needed medication.   If a prior authorization is required to get your medication covered by your insurance company, please allow us 1-2 business days to complete this process.  Drug prices often vary depending on where the prescription is filled and some pharmacies may offer cheaper prices.  The website www.goodrx.com contains coupons for medications through different pharmacies. The prices here do not account for what the cost may be with help from insurance (it may be cheaper with your insurance), but the website can give you the price if you did not use any insurance.  - You can print the associated coupon and take it with your prescription to the pharmacy.  - You may also stop by our office during regular business hours and pick up a GoodRx coupon card.  - If you need your prescription sent electronically to a different pharmacy, notify our office through [facility] MyChart or by phone at [phone] option 4.     Si Usted Necesita Algo Despus de Su Visita  Tambin puede enviarnos un mensaje a travs de MyChart. Por lo general respondemos a los mensajes de MyChart en el transcurso de 1 a 2 das hbiles.  Para renovar recetas, por favor pida a su farmacia que se ponga en contacto con nuestra oficina. Nuestro nmero de fax es el [phone].  Si tiene un asunto urgente cuando la clnica est cerrada y que no puede esperar hasta el siguiente da hbil, puede llamar/localizar a su doctor(a) al nmero que aparece a continuacin.   Por favor, tenga en cuenta que aunque hacemos todo lo posible para estar disponibles para asuntos urgentes fuera del horario de oficina, no estamos disponibles las 24 horas del da, los 7 das de la semana.   Si tiene un problema urgente y no puede comunicarse con nosotros, puede  optar por buscar atencin mdica  en el consultorio de su doctor(a), en una clnica privada, en un centro de atencin urgente o en una sala de emergencias.  Si tiene una emergencia mdica, por favor llame inmediatamente al 911 o vaya a la sala de emergencias.  Nmeros de bper  - Dr. Kowalski: [phone]  - Dra. Moye: [phone]  - Dra. Stewart: [phone]  En caso de inclemencias del tiempo, por favor llame a nuestra lnea principal al [phone] para una actualizacin sobre el estado de cualquier retraso o cierre.  Consejos para la medicacin en dermatologa: Por favor, guarde las cajas en las que vienen los medicamentos de uso tpico para ayudarle a seguir las instrucciones sobre dnde y cmo usarlos. Las farmacias generalmente imprimen las instrucciones del medicamento slo en las cajas y no directamente en los tubos del medicamento.   Si su medicamento es muy caro, por favor, pngase en contacto con   nuestra oficina llamando al [phone] y presione la opcin 4 o envenos un mensaje a travs de MyChart.   No podemos decirle cul ser su copago por los medicamentos por adelantado ya que esto es diferente dependiendo de la cobertura de su seguro. Sin embargo, es posible que podamos encontrar un medicamento sustituto a menor costo o llenar un formulario para que el seguro cubra el medicamento que se considera necesario.   Si se requiere una autorizacin previa para que su compaa de seguros cubra su medicamento, por favor permtanos de 1 a 2 das hbiles para completar este proceso.  Los precios de los medicamentos varan con frecuencia dependiendo del lugar de dnde se surte la receta y alguna farmacias pueden ofrecer precios ms baratos.  El sitio web www.goodrx.com tiene cupones para medicamentos de diferentes farmacias. Los precios aqu no tienen en cuenta lo que podra costar con la ayuda del seguro (puede ser ms barato con su seguro), pero el sitio web puede darle el  precio si no utiliz ningn seguro.  - Puede imprimir el cupn correspondiente y llevarlo con su receta a la farmacia.  - Tambin puede pasar por nuestra oficina durante el horario de atencin regular y recoger una tarjeta de cupones de GoodRx.  - Si necesita que su receta se enve electrnicamente a una farmacia diferente, informe a nuestra oficina a travs de MyChart de [facility] o por telfono llamando al [phone] y presione la opcin 4.  

## 2022-12-09 NOTE — Progress Notes (Unsigned)
Follow-Up Visit   Subjective  Bethany Aguilar is a 32 y.o. female who presents for the following: Skin Cancer Screening and Full Body Skin Exam. Hx of acne patient was previously treating with Doxycycline 50 mg po QD and Adapalene 0.3% gel QHS which worked well for her.   The patient presents for Total-Body Skin Exam (TBSE) for skin cancer screening and mole check. The patient has spots, moles and lesions to be evaluated, some may be new or changing and the patient has concerns that these could be cancer.  The following portions of the chart were reviewed this encounter and updated as appropriate: medications, allergies, medical history  Review of Systems:  No other skin or systemic complaints except as noted in HPI or Assessment and Plan.  Objective  Well appearing patient in no apparent distress; mood and affect are within normal limits.  A full examination was performed including scalp, head, eyes, ears, nose, lips, neck, chest, axillae, abdomen, back, buttocks, bilateral upper extremities, bilateral lower extremities, hands, feet, fingers, toes, fingernails, and toenails. All findings within normal limits unless otherwise noted below.    R upper back paraspinal 0.7 x 0.5 cm irregular brown macule.   Assessment & Plan   LENTIGINES, SEBORRHEIC KERATOSES, HEMANGIOMAS - Benign normal skin lesions - Benign-appearing - Call for any changes  MELANOCYTIC NEVI - Tan-brown and/or pink-flesh-colored symmetric macules and papules - Benign appearing on exam today - Observation - Call clinic for new or changing moles - Recommend daily use of broad spectrum spf 30+ sunscreen to sun-exposed areas.   ACTINIC DAMAGE - Chronic condition, secondary to cumulative UV/sun exposure - diffuse scaly erythematous macules with underlying dyspigmentation - Recommend daily broad spectrum sunscreen SPF 30+ to sun-exposed areas, reapply every 2 hours as needed.  - Staying in the shade or wearing  long sleeves, sun glasses (UVA+UVB protection) and wide brim hats (4-inch brim around the entire circumference of the hat) are also recommended for sun protection.  - Call for new or changing lesions.  Keratosis Pilaris - Tiny follicular keratotic papules - Benign. Genetic in nature. No cure. - Observe. - If desired, patient can use an emollient (moisturizer) containing ammonium lactate, urea or salicylic acid once a day to smooth the area  History of Dysplastic Nevi - No evidence of recurrence today - Recommend regular full body skin exams - Recommend daily broad spectrum sunscreen SPF 30+ to sun-exposed areas, reapply every 2 hours as needed.  - Call if any new or changing lesions are noted between office visits  ACNE VULGARIS Open comedones and inflammatory papules Chronic and persistent condition with duration or expected duration over one year. Condition is bothersome/symptomatic for patient. Currently flared. Treatment Plan: Restart Doxycycline 50 mg po QD with food and plenty of drink.  Doxycycline should be taken with food to prevent nausea. Do not lay down for 30 minutes after taking. Be cautious with sun exposure and use good sun protection while on this medication. Pregnant women should not take this medication.   Restart Adapalene 0.3% gel QHS. Topical retinoid medications like tretinoin/Retin-A, adapalene/Differin, tazarotene/Fabior, and Epiduo/Epiduo Forte can cause dryness and irritation when first started. Only apply a pea-sized amount to the entire affected area. Avoid applying it around the eyes, edges of mouth and creases at the nose. If you experience irritation, use a good moisturizer first and/or apply the medicine less often. If you are doing well with the medicine, you can increase how often you use it until you are  applying every night. Be careful with sun protection while using this medication as it can make you sensitive to the sun. This medicine should not be used  by pregnant women.   Neoplasm of uncertain behavior of skin R upper back paraspinal Epidermal / dermal shaving  Lesion diameter (cm):  0.7 Informed consent: discussed and consent obtained   Timeout: patient name, date of birth, surgical site, and procedure verified   Procedure prep:  Patient was prepped and draped in usual sterile fashion Prep type:  Isopropyl alcohol Anesthesia: the lesion was anesthetized in a standard fashion   Anesthetic:  1% lidocaine w/ epinephrine 1-100,000 buffered w/ 8.4% NaHCO3 Instrument used: flexible razor blade   Hemostasis achieved with: pressure, aluminum chloride and electrodesiccation   Outcome: patient tolerated procedure well   Post-procedure details: sterile dressing applied and wound care instructions given   Dressing type: bandage and petrolatum    Specimen 1 - Surgical pathology Differential Diagnosis: D48.5 r/o dysplastic nevus Check Margins: No  Acne vulgaris Related Medications doxycycline (VIBRAMYCIN) 50 MG capsule Take 1 capsule (50 mg total) by mouth daily. With food and plenty of fluid  Adapalene (DIFFERIN) 0.3 % gel Apply a thin coat to the entire face QHS.  SKIN CANCER SCREENING PERFORMED TODAY.  Return in about 6 months (around 06/11/2023) for TBSE and acne follow up.  Luther Redo, CMA, am acting as scribe for Sarina Ser, MD .  Documentation: I have reviewed the above documentation for accuracy and completeness, and I agree with the above.  Sarina Ser, MD

## 2022-12-10 ENCOUNTER — Encounter: Payer: Self-pay | Admitting: Dermatology

## 2022-12-17 ENCOUNTER — Telehealth: Payer: Self-pay

## 2022-12-17 NOTE — Telephone Encounter (Signed)
Patient informed of pathology results 

## 2022-12-17 NOTE — Telephone Encounter (Signed)
-----   Message from Ralene Bathe, MD sent at 12/12/2022  6:09 PM EDT ----- Diagnosis Skin , right upper back paraspinal DYSPLASTIC COMPOUND NEVUS WITH MILD ATYPIA, CLOSE TO MARGIN  Mild Dysplastic Recheck next visit

## 2023-02-07 ENCOUNTER — Ambulatory Visit: Payer: Self-pay | Admitting: Physician Assistant

## 2023-02-13 ENCOUNTER — Ambulatory Visit: Payer: Self-pay | Admitting: Physician Assistant

## 2023-02-18 ENCOUNTER — Ambulatory Visit: Payer: Self-pay | Admitting: Physician Assistant

## 2023-03-03 ENCOUNTER — Ambulatory Visit (INDEPENDENT_AMBULATORY_CARE_PROVIDER_SITE_OTHER): Payer: BC Managed Care – PPO | Admitting: Physician Assistant

## 2023-03-03 VITALS — BP 114/66 | HR 56 | Temp 98.1°F | Resp 13 | Ht 65.0 in | Wt 157.8 lb

## 2023-03-03 DIAGNOSIS — Z124 Encounter for screening for malignant neoplasm of cervix: Secondary | ICD-10-CM

## 2023-03-03 DIAGNOSIS — Z0000 Encounter for general adult medical examination without abnormal findings: Secondary | ICD-10-CM | POA: Diagnosis not present

## 2023-03-03 NOTE — Progress Notes (Signed)
I,Bethany  Aguilar,acting as a Neurosurgeon for [employer], PA-C.,have documented all relevant documentation on the behalf of Alfredia Ferguson, PA-C,as directed by  Alfredia Ferguson, PA-C while in the presence of Alfredia Ferguson, PA-C.   New patient visit   Patient: Bethany Aguilar   DOB: [birth year]   31 y.o. Female  MRN: [id number] Visit Date: 03/03/2023  Today's healthcare provider: Alfredia Ferguson, PA-C   Cc. Cpe, new pt  Subjective    Bethany Aguilar is a 32 y.o. female who presents today as a new patient to establish care.  HPI  Discussed the use of AI scribe software for clinical note transcription with the patient, who gave verbal consent to proceed.  History of Present Illness   The patient presents to establish care, for a cpe. They have a history of multiple mole removals but otherwise have no significant medical history. They deny drug use and report occasional alcohol consumption. They are married and do not have children. They do not currently have a gynecologist as their previous provider retired. Their last Pap smear was prior to the COVID-19 pandemic. They do not take any medications and use condoms for birth control.      Past Medical History:  Diagnosis Date   Contraceptive management    Dysplastic nevus 05/02/2016   mid back spinal below bra   Dysplastic nevus 11/06/2016   left low back lat flank   Dysplastic nevus 05/14/2018   right low back 4.0 cm lat to spine sup, right low back 4.0 cm lat to spine inf, right upper back 3.0 cm lat to spine   Dysplastic nevus 09/14/2018   left med breast, right lat epigastric   Dysplastic nevus 06/07/2020   R ant axillary fold, moderat atypia   Dysplastic nevus 06/07/2020   L scapula, moderate to severe atypia - shave removal    Dysplastic nevus 06/07/2020   L mid back, 6.0cm lat to spine, severe excised 08/08/20   Dysplastic nevus 06/07/2020   R sup medial scapula, moderate to severe atypia - shave removal    Dysplastic  nevus 06/07/2020   R lower back, moderate atypia   Dysplastic nevus 08/01/2021   Right upper back paraspinal - Moderate   Dysplastic nevus 08/01/2021   Left upper back medial to mid scapula 4.0 cm lat to spine - Moderate to severe - Recheck on follow up   Dysplastic nevus 12/09/2022   R upper back paraspinal - mild   Past Surgical History:  Procedure Laterality Date   SKIN BIOPSY     back, stomach and leg; atypical cells   Family Status  Relation Name Status   PGM  Alive   Mat Aunt  Alive   MGF  Deceased   Mother  Alive, age 51y   Father  Alive, age 13y   MGM  Alive   PGF  Alive   Neg Hx  (Not Specified)   Family History  Problem Relation Age of Onset   Skin cancer Paternal Grandmother    Diabetes Maternal Aunt    Throat cancer Maternal Aunt 60   Diabetes Maternal Grandfather    Skin cancer Maternal Grandfather    Breast cancer Neg Hx    Ovarian cancer Neg Hx    Social History   Socioeconomic History   Marital status: Married    Spouse name: Jillyn Hidden   Number of children: 0   Years of education: 16   Highest education level: Bachelor's degree (e.g., BA, AB, BS)  Occupational History   Occupation: MARKETING/SOCIAL MEDIA  Tobacco Use   Smoking status: Never   Smokeless tobacco: Never  Vaping Use   Vaping Use: Never used  Substance and Sexual Activity   Alcohol use: Yes    Comment: 1-2/month   Drug use: No   Sexual activity: Yes    Partners: Male    Birth control/protection: Pill  Other Topics Concern   Not on file  Social History Narrative   Not on file   Social Determinants of Health   Financial Resource Strain: Low Risk  (03/03/2023)   Overall Financial Resource Strain (CARDIA)    Difficulty of Paying Living Expenses: Not hard at all  Food Insecurity: No Food Insecurity (03/03/2023)   Hunger Aguilar Sign    Worried About Running Out of Food in the Last Year: Never true    Ran Out of Food in the Last Year: Never true  Transportation Needs: No  Transportation Needs (03/03/2023)   PRAPARE - Administrator, Civil Service (Medical): No    Lack of Transportation (Non-Medical): No  Physical Activity: Sufficiently Active (03/03/2023)   Exercise Aguilar Sign    Days of Exercise per Week: 6 days    Minutes of Exercise per Session: 30 min  Stress: No Stress Concern Present (03/03/2023)   [employer] of Occupational Health - Occupational Stress Questionnaire    Feeling of Stress : Only a little  Social Connections: Moderately Integrated (03/03/2023)   Social Connection and Isolation Panel [NHANES]    Frequency of Communication with Friends and Family: More than three times a week    Frequency of Social Gatherings with Friends and Family: Once a week    Attends Religious Services: More than 4 times per year    Active Member of [employer] or Organizations: No    Attends Engineer, structural: Not on file    Marital Status: Married   Outpatient Medications Prior to Visit  Medication Sig   Adapalene (DIFFERIN) 0.3 % gel Apply a thin coat to the entire face QHS.   [DISCONTINUED] doxycycline (VIBRAMYCIN) 50 MG capsule Take 1 capsule (50 mg total) by mouth daily. With food and plenty of fluid   [DISCONTINUED] Lactic Ac-Citric Ac-Pot Bitart (PHEXXI) 1.8-1-0.4 % GEL Place 1 Applicatorful vaginally daily as needed. Within 1 hour prior to  having intercourse   No facility-administered medications prior to visit.   No Known Allergies  Immunization History  Administered Date(s) Administered   Tdap 09/24/2013    Health Maintenance  Topic Date Due   COVID-19 Vaccine (1) Never done   HIV Screening  Never done   Hepatitis C Screening  Never done   PAP SMEAR-Modifier  11/04/2021   INFLUENZA VACCINE  04/17/2023   DTaP/Tdap/Td (2 - Td or Tdap) 09/25/2023   HPV VACCINES  Aged Out    Patient Care Team: Alfredia Ferguson, PA-C as PCP - General (Physician Assistant)  Review of Systems  Neurological:  Positive for  light-headedness.  All other systems reviewed and are negative.    Objective    BP 114/66 (BP Location: Left Arm, Patient Position: Sitting, Cuff Size: Normal)   Pulse (!) 56   Temp 98.1 F (36.7 C) (Oral)   Resp 13   Ht 5\' 5"  (1.651 m)   Wt 157 lb 12.8 oz (71.6 kg)   LMP 03/01/2023 (Exact Date)   SpO2 99%   BMI 26.26 kg/m   Physical Exam Constitutional:      General: She is  awake.     Appearance: She is well-developed. She is not ill-appearing.  HENT:     Head: Normocephalic.     Right Ear: Tympanic membrane normal.     Left Ear: Tympanic membrane normal.     Nose: Nose normal. No congestion or rhinorrhea.     Mouth/Throat:     Pharynx: No oropharyngeal exudate or posterior oropharyngeal erythema.  Eyes:     Conjunctiva/sclera: Conjunctivae normal.     Pupils: Pupils are equal, round, and reactive to light.  Neck:     Thyroid: No thyroid mass or thyromegaly.  Cardiovascular:     Rate and Rhythm: Normal rate and regular rhythm.     Heart sounds: Normal heart sounds.  Pulmonary:     Effort: Pulmonary effort is normal.     Breath sounds: Normal breath sounds.  Abdominal:     Palpations: Abdomen is soft.     Tenderness: There is no abdominal tenderness.  Musculoskeletal:     Right lower leg: No swelling. No edema.     Left lower leg: No swelling. No edema.  Lymphadenopathy:     Cervical: No cervical adenopathy.  Skin:    General: Skin is warm.  Neurological:     Mental Status: She is alert and oriented to person, place, and time.  Psychiatric:        Attention and Perception: Attention normal.        Mood and Affect: Mood normal.        Speech: Speech normal.        Behavior: Behavior normal. Behavior is cooperative.    Depression Screen    03/03/2023    3:31 PM 12/01/2020    8:59 AM  PHQ 2/9 Scores  PHQ - 2 Score 0 2  PHQ- 9 Score 1 5   No results found for any visits on 03/03/23.  Assessment & Plan     1. Annual physical exam - CBC  w/Diff/Platelet - Comprehensive Metabolic Panel (CMET) - Lipid Profile - HgB A1c  2. Cervical cancer screening - Ambulatory referral to Gynecology    Return in about 1 year (around 03/02/2024) for CPE.     I, Alfredia Ferguson, PA-C have reviewed all documentation for this visit. The documentation on  03/03/23   for the exam, diagnosis, procedures, and orders are all accurate and complete.  Alfredia Ferguson, PA-C [facility] [address] [place], [place], [address] Office: [phone] Fax: [phone]   [facility]

## 2023-05-20 LAB — HM PAP SMEAR: HM Pap smear: NEGATIVE

## 2023-06-11 ENCOUNTER — Encounter: Payer: Self-pay | Admitting: Dermatology

## 2023-06-11 ENCOUNTER — Ambulatory Visit: Payer: BC Managed Care – PPO | Admitting: Dermatology

## 2023-06-11 VITALS — BP 110/76

## 2023-06-11 DIAGNOSIS — L700 Acne vulgaris: Secondary | ICD-10-CM

## 2023-06-11 DIAGNOSIS — D229 Melanocytic nevi, unspecified: Secondary | ICD-10-CM

## 2023-06-11 DIAGNOSIS — D225 Melanocytic nevi of trunk: Secondary | ICD-10-CM | POA: Diagnosis not present

## 2023-06-11 DIAGNOSIS — L814 Other melanin hyperpigmentation: Secondary | ICD-10-CM

## 2023-06-11 DIAGNOSIS — L821 Other seborrheic keratosis: Secondary | ICD-10-CM

## 2023-06-11 DIAGNOSIS — L578 Other skin changes due to chronic exposure to nonionizing radiation: Secondary | ICD-10-CM | POA: Diagnosis not present

## 2023-06-11 DIAGNOSIS — Z86018 Personal history of other benign neoplasm: Secondary | ICD-10-CM

## 2023-06-11 DIAGNOSIS — D492 Neoplasm of unspecified behavior of bone, soft tissue, and skin: Secondary | ICD-10-CM

## 2023-06-11 DIAGNOSIS — Z1283 Encounter for screening for malignant neoplasm of skin: Secondary | ICD-10-CM | POA: Diagnosis not present

## 2023-06-11 DIAGNOSIS — D1801 Hemangioma of skin and subcutaneous tissue: Secondary | ICD-10-CM

## 2023-06-11 DIAGNOSIS — Z7189 Other specified counseling: Secondary | ICD-10-CM

## 2023-06-11 DIAGNOSIS — Z79899 Other long term (current) drug therapy: Secondary | ICD-10-CM

## 2023-06-11 DIAGNOSIS — W908XXA Exposure to other nonionizing radiation, initial encounter: Secondary | ICD-10-CM | POA: Diagnosis not present

## 2023-06-11 MED ORDER — ORACEA 40 MG PO CPDR: 1.0000 | DELAYED_RELEASE_CAPSULE | Freq: Every day | ORAL | 6 refills | Status: DC

## 2023-06-11 NOTE — Progress Notes (Signed)
Follow-Up Visit   Subjective  Bethany Aguilar is a 32 y.o. female who presents for the following: Skin Cancer Screening and Full Body Skin Exam, hx of Dysplastic nevi, Acne only got 1 month of Doxycyline and did not know it had refills, Adapalene 0.3% gel qhs, pt feels face has improved  The patient presents for Total-Body Skin Exam (TBSE) for skin cancer screening and mole check. The patient has spots, moles and lesions to be evaluated, some may be new or changing and the patient may have concern these could be cancer.  The following portions of the chart were reviewed this encounter and updated as appropriate: medications, allergies, medical history  Review of Systems:  No other skin or systemic complaints except as noted in HPI or Assessment and Plan.  Objective  Well appearing patient in no apparent distress; mood and affect are within normal limits.  A full examination was performed including scalp, head, eyes, ears, nose, lips, neck, chest, axillae, abdomen, back, buttocks, bilateral upper extremities, bilateral lower extremities, hands, feet, fingers, toes, fingernails, and toenails. All findings within normal limits unless otherwise noted below.   Relevant physical exam findings are noted in the Assessment and Plan.  L upper back 3.0cm lat to spine 0.8cm irregular brown macule       L mid back 3.0cm lat to spine 0.6cm irregular brown macule       R mid back lat infra scapular 1.1cm irregular brown macule         Assessment & Plan   SKIN CANCER SCREENING PERFORMED TODAY.  ACTINIC DAMAGE - Chronic condition, secondary to cumulative UV/sun exposure - diffuse scaly erythematous macules with underlying dyspigmentation - Recommend daily broad spectrum sunscreen SPF 30+ to sun-exposed areas, reapply every 2 hours as needed.  - Staying in the shade or wearing long sleeves, sun glasses (UVA+UVB protection) and wide brim hats (4-inch brim around the entire  circumference of the hat) are also recommended for sun protection.  - Call for new or changing lesions.  LENTIGINES, SEBORRHEIC KERATOSES, HEMANGIOMAS - Benign normal skin lesions - Benign-appearing - Call for any changes  MELANOCYTIC NEVI - Tan-brown and/or pink-flesh-colored symmetric macules and papules - Benign appearing on exam today - Observation - Call clinic for new or changing moles - Recommend daily use of broad spectrum spf 30+ sunscreen to sun-exposed areas.   HISTORY OF DYSPLASTIC NEVUS No evidence of recurrence today Recommend regular full body skin exams Recommend daily broad spectrum sunscreen SPF 30+ to sun-exposed areas, reapply every 2 hours as needed.  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 Condition is symptomatic/ bothersome to patient. Not currently at goal.   Treatment Plan: Start Oracea 40mg  1 po qd with food and drink Cont Adapalene 0.3% gel qhs  Doxycycline should be taken with food to prevent nausea. Do not lay down for 30 minutes after taking. Be cautious with sun exposure and use good sun protection while on this medication. Pregnant women should not take this medication.   Topical retinoid medications like tretinoin/Retin-A, adapalene/Differin, tazarotene/Fabior, and Epiduo/Epiduo Forte can cause dryness and irritation when first started. Only apply a pea-sized amount to the entire affected area. Avoid applying it around the eyes, edges of mouth and creases at the nose. If you experience irritation, use a good moisturizer first and/or  apply the medicine less often. If you are doing well with the medicine, you can increase how often you use it until you are applying every night. Be careful with sun protection while using this medication as it can make you sensitive to the sun.  This medicine should not be used by pregnant women.    Neoplasm of skin (3) L upper back 3.0cm lat to spine  Epidermal / dermal shaving  Lesion diameter (cm):  0.8 Informed consent: discussed and consent obtained   Timeout: patient name, date of birth, surgical site, and procedure verified   Procedure prep:  Patient was prepped and draped in usual sterile fashion Prep type:  Isopropyl alcohol Anesthesia: the lesion was anesthetized in a standard fashion   Anesthetic:  1% lidocaine w/ epinephrine 1-100,000 buffered w/ 8.4% NaHCO3 Instrument used: flexible razor blade   Hemostasis achieved with: pressure, aluminum chloride and electrodesiccation   Outcome: patient tolerated procedure well   Post-procedure details: sterile dressing applied and wound care instructions given   Dressing type: bandage and bacitracin    Specimen 1 - Surgical pathology Differential Diagnosis: D48.5 Nevus vs Dysplastic Nevus  Check Margins: yes 0.8cm irregular brown macule  L mid back 3.0cm lat to spine  Epidermal / dermal shaving  Lesion diameter (cm):  0.6 Informed consent: discussed and consent obtained   Timeout: patient name, date of birth, surgical site, and procedure verified   Procedure prep:  Patient was prepped and draped in usual sterile fashion Prep type:  Isopropyl alcohol Anesthesia: the lesion was anesthetized in a standard fashion   Anesthetic:  1% lidocaine w/ epinephrine 1-100,000 buffered w/ 8.4% NaHCO3 Instrument used: flexible razor blade   Hemostasis achieved with: pressure, aluminum chloride and electrodesiccation   Outcome: patient tolerated procedure well   Post-procedure details: sterile dressing applied and wound care instructions given   Dressing type: bandage and bacitracin    Specimen 2 - Surgical pathology Differential Diagnosis: D48.5 Nevus vs Dysplastic Nevus  Check Margins: yes 0.6cm irregular brown macule  R mid back lat infra scapular  Epidermal / dermal  shaving  Lesion diameter (cm):  1.1 Informed consent: discussed and consent obtained   Timeout: patient name, date of birth, surgical site, and procedure verified   Procedure prep:  Patient was prepped and draped in usual sterile fashion Prep type:  Isopropyl alcohol Anesthesia: the lesion was anesthetized in a standard fashion   Anesthetic:  1% lidocaine w/ epinephrine 1-100,000 buffered w/ 8.4% NaHCO3 Instrument used: flexible razor blade   Hemostasis achieved with: pressure, aluminum chloride and electrodesiccation   Outcome: patient tolerated procedure well   Post-procedure details: sterile dressing applied and wound care instructions given   Dressing type: bandage and bacitracin    Specimen 3 - Surgical pathology Differential Diagnosis: D48.5 Nevus vs Dysplastic Nevus  Check Margins: yes 1.1cm irregular brown macule   Return in about 6 months (around 12/09/2023) for TBSE, Hx of Dysplastic nevi.  I, Ardis Rowan, RMA, am acting as scribe for Armida Sans, MD .   Documentation: I have reviewed the above documentation for accuracy and completeness, and I agree with the above.  Armida Sans, MD

## 2023-06-11 NOTE — Patient Instructions (Addendum)

## 2023-06-17 LAB — SURGICAL PATHOLOGY

## 2023-06-18 ENCOUNTER — Telehealth: Payer: Self-pay

## 2023-06-18 NOTE — Telephone Encounter (Signed)
-----   Message from Armida Sans sent at 06/18/2023  1:23 PM EDT ----- FINAL DIAGNOSIS        1. Skin, L upper back 3.0 cm lat to spine :       DYSPLASTIC COMPOUND NEVUS WITH MODERATE ATYPIA, DEEP MARGIN INVOLVED        2. Skin, L mid back 3.0cm lat to spine :       DYSPLASTIC NEVUS WITH MODERATE TO SEVERE ATYPIA, INFLAMED, DEEP MARGIN INVOLVED,       SEE DESCRIPTION        3. Skin, R mid back lat infra scapular :       DYSPLASTIC COMPOUND NEVUS WITH MODERATE ATYPIA, CLOSE TO MARGIN   1&3 - Moderate dysplastic Recheck next visit 2- Severe dysplastic Schedule surgery

## 2023-06-18 NOTE — Telephone Encounter (Signed)
Patient informed of pathology results and surgery scheduled. 

## 2023-06-24 ENCOUNTER — Ambulatory Visit (INDEPENDENT_AMBULATORY_CARE_PROVIDER_SITE_OTHER): Payer: BC Managed Care – PPO | Admitting: Dermatology

## 2023-06-24 ENCOUNTER — Encounter: Payer: Self-pay | Admitting: Dermatology

## 2023-06-24 DIAGNOSIS — D235 Other benign neoplasm of skin of trunk: Secondary | ICD-10-CM

## 2023-06-24 DIAGNOSIS — D239 Other benign neoplasm of skin, unspecified: Secondary | ICD-10-CM

## 2023-06-24 MED ORDER — MUPIROCIN 2 % EX OINT: TOPICAL_OINTMENT | CUTANEOUS | 0 refills | Status: DC

## 2023-06-24 NOTE — Progress Notes (Signed)
Follow-Up Visit   Subjective  Bethany Aguilar is a 32 y.o. female who presents for the following: bx proven moderate to severely dysplastic nevus with deep margins involved on the L mid back 3.0 cm lat to spine, patient here today for excision.   The following portions of the chart were reviewed this encounter and updated as appropriate: medications, allergies, medical history  Review of Systems:  No other skin or systemic complaints except as noted in HPI or Assessment and Plan.  Objective  Well appearing patient in no apparent distress; mood and affect are within normal limits.   A focused examination was performed of the following areas: the back   Relevant exam findings are noted in the Assessment and Plan.  L mid back 3.0 cm lat to spine Pink bx site.    Assessment & Plan     Dysplastic nevus L mid back 3.0 cm lat to spine  Bx proven moderate to severe with deep margins involved - start Mupirocin 2% ointment to aa QD.   Skin excision - L mid back 3.0 cm lat to spine  Lesion length (cm):  1 Lesion width (cm):  0.7 Margin per side (cm):  0.2 Total excision diameter (cm):  1.4 Informed consent: discussed and consent obtained   Timeout: patient name, date of birth, surgical site, and procedure verified   Procedure prep:  Patient was prepped and draped in usual sterile fashion Prep type:  Isopropyl alcohol and povidone-iodine Anesthesia: the lesion was anesthetized in a standard fashion   Anesthetic:  1% lidocaine w/ epinephrine 1-100,000 buffered w/ 8.4% NaHCO3 (6 cc lidocaine w/epi, 3 cc bupivocaine) Hemostasis achieved with: pressure   Hemostasis achieved with comment:  Electrocautery Outcome: patient tolerated procedure well with no complications   Post-procedure details: sterile dressing applied and wound care instructions given   Dressing type: bandage and pressure dressing    Skin repair - L mid back 3.0 cm lat to spine Complexity:  Complex Final  length (cm):  4 Informed consent: discussed and consent obtained   Timeout: patient name, date of birth, surgical site, and procedure verified   Procedure prep:  Patient was prepped and draped in usual sterile fashion Prep type:  Povidone-iodine Anesthesia: the lesion was anesthetized in a standard fashion   Reason for type of repair: reduce tension to allow closure, reduce the risk of dehiscence, infection, and necrosis, reduce subcutaneous dead space and avoid a hematoma, allow closure of the large defect, preserve normal anatomy, preserve normal anatomical and functional relationships and enhance both functionality and cosmetic results   Undermining: edges could be approximated without difficulty   Undermining comment:  Undermining defect 1.4 cm Subcutaneous layers (deep stitches):  Suture size:  2-0 Suture type: Vicryl (polyglactin 910)   Fine/surface layer approximation (top stitches):  Suture size:  3-0 Stitches: simple running   Suture removal (days):  7 Hemostasis achieved with: suture and pressure Outcome: patient tolerated procedure well with no complications   Post-procedure details: sterile dressing applied and wound care instructions given   Dressing type: bandage and pressure dressing (Mupirocin 2% ointment)    Specimen 1 - Surgical pathology Differential Diagnosis: Bx proven dysplastic nevus [id number] Check Margins: Yes    Return in about 1 week (around 07/01/2023) for suture removal.  I, Cari Caraway, CMA, am acting as scribe for Armida Sans, MD .   Documentation: I have reviewed the above documentation for accuracy and completeness, and I agree with the above.  Armida Sans,  MD

## 2023-06-24 NOTE — Patient Instructions (Signed)

## 2023-06-25 ENCOUNTER — Telehealth: Payer: Self-pay

## 2023-06-25 NOTE — Telephone Encounter (Signed)
Patient doing fine after yesterdays surgery./sh 

## 2023-06-26 LAB — SURGICAL PATHOLOGY

## 2023-07-01 ENCOUNTER — Ambulatory Visit (INDEPENDENT_AMBULATORY_CARE_PROVIDER_SITE_OTHER): Payer: BC Managed Care – PPO | Admitting: Dermatology

## 2023-07-01 DIAGNOSIS — Z86018 Personal history of other benign neoplasm: Secondary | ICD-10-CM

## 2023-07-01 NOTE — Progress Notes (Deleted)
.  notesut

## 2023-07-01 NOTE — Patient Instructions (Signed)
After Suture Removal  If your medical team has placed Steri-Strips (white adhesive strips covering the surgical site to provide extra support): Keep the area dry until they fall off.  Do not peel them off. Just let them fall off on their own.  If the edges peel up, you can trim them with scissors.   If your team has not placed Steri-Strips: Wash the area daily with soap and water. Then coat the incision site with plain Vaseline and cover with a bandage. Do this daily for 5 days after the sutures are removed. After that, no additional wound care is generally needed.  However, if you would like to help fade the scar, you can apply a silicone scar cream, gel or sheet every night. The scar will remodel for one year after the procedure. If a skin cancer was removed, be sure to keep your appointment with your dermatologist for follow-up and let your dermatology team know if you have any new or changing spots between visits.    Please call our office at [phone] for any questions or concerns.Due to recent changes in healthcare laws, you may see results of your pathology and/or laboratory studies on MyChart before the doctors have had a chance to review them. We understand that in some cases there may be results that are confusing or concerning to you. Please understand that not all results are received at the same time and often the doctors may need to interpret multiple results in order to provide you with the best plan of care or course of treatment. Therefore, we ask that you please give [name] 2 business days to thoroughly review all your results before contacting the office for clarification. Should we see a critical lab result, you will be contacted sooner.   If You Need Anything After Your Visit  If you have any questions or concerns for your doctor, please call our main line at [phone] and press option 4 to reach your doctor's medical assistant. If no one answers, please leave a  voicemail as directed and we will return your call as soon as possible. Messages left after 4 pm will be answered the following business day.   You may also send [name] a message via MyChart. We typically respond to MyChart messages within 1-2 business days.  For prescription refills, please ask your pharmacy to contact our office. Our fax number is [phone].  If you have an urgent issue when the clinic is closed that cannot wait until the next business day, you can page your doctor at the number below.    Please note that while we do our best to be available for urgent issues outside of office hours, we are not available 24/7.   If you have an urgent issue and are unable to reach [name], you may choose to seek medical care at your doctor's office, retail clinic, urgent care center, or emergency room.  If you have a medical emergency, please immediately call 911 or go to the emergency department.  Pager Numbers  - Dr. Gwen Pounds: [phone]  - Dr. Roseanne Reno: [phone]  - Dr. Katrinka Blazing: [phone]   In the event of inclement weather, please call our main line at [phone] for an update on the status of any delays or closures.  Dermatology Medication Tips: Please keep the boxes that topical medications come in in order to help keep track of the instructions about where and how to use these. Pharmacies typically print the medication instructions only on  the boxes and not directly on the medication tubes.   If your medication is too expensive, please contact our office at [phone] option 4 or send [name] a message through MyChart.   We are unable to tell what your co-pay for medications will be in advance as this is different depending on your insurance coverage. However, we may be able to find a substitute medication at lower cost or fill out paperwork to get insurance to cover a needed medication.   If a prior authorization is required to get your medication covered by your insurance  company, please allow [name] 1-2 business days to complete this process.  Drug prices often vary depending on where the prescription is filled and some pharmacies may offer cheaper prices.  The website www.goodrx.com contains coupons for medications through different pharmacies. The prices here do not account for what the cost may be with help from insurance (it may be cheaper with your insurance), but the website can give you the price if you did not use any insurance.  - You can print the associated coupon and take it with your prescription to the pharmacy.  - You may also stop by our office during regular business hours and pick up a GoodRx coupon card.  - If you need your prescription sent electronically to a different pharmacy, notify our office through [facility] or by phone at [phone] option 4.     Si Usted Necesita Algo Despus de Su Visita  Tambin puede enviarnos un mensaje a travs de Clinical cytogeneticist. Por lo general respondemos a los mensajes de MyChart en el transcurso de 1 a 2 das hbiles.  Para renovar recetas, por favor pida a su farmacia que se ponga en contacto con nuestra oficina. Annie Sable de fax es [place] [phone].  Si tiene un asunto urgente cuando la clnica est cerrada y que no puede esperar hasta el siguiente da hbil, puede llamar/localizar a su doctor(a) al nmero que aparece a continuacin.   Por favor, tenga en cuenta que aunque hacemos todo lo posible para estar disponibles para asuntos urgentes fuera del horario de [place], no estamos disponibles las 24 horas del da, los 7 [address]  [address] de la [place].   Si tiene un problema urgente y no puede comunicarse con nosotros, puede optar por buscar atencin mdica  en el consultorio de su doctor(a), en una clnica privada, en un centro de atencin urgente o en una sala de emergencias.  Si tiene Engineer, drilling, por favor llame inmediatamente al 911 o vaya a la sala de emergencias.  Nmeros de bper  - Dr.  Gwen Pounds: [phone]  - Dra. Roseanne Reno: [phone]  - Dr. Katrinka Blazing: [phone]   En caso de inclemencias del tiempo, por favor llame a Lacy Duverney principal al [phone] para una actualizacin sobre el [place] de cualquier retraso o cierre.  Consejos para la medicacin en dermatologa: Por favor, guarde las cajas en las que vienen los medicamentos de uso tpico para ayudarle a seguir las instrucciones sobre dnde y cmo usarlos. Las farmacias generalmente imprimen las instrucciones del medicamento slo en las cajas y no directamente en los tubos del [place].   Si su medicamento es muy caro, por favor, pngase en contacto con Rolm Gala llamando al [phone] y presione la opcin 4 o envenos un mensaje a travs de Clinical cytogeneticist.   No podemos decirle cul ser su copago por los medicamentos por adelantado ya que esto es diferente dependiendo de la cobertura de su seguro. Sin embargo, es posible  que podamos encontrar un medicamento sustituto a Audiological scientist un formulario para que el seguro cubra el medicamento que se considera necesario.   Si se requiere una autorizacin previa para que su compaa de seguros [place] su medicamento, por favor permtanos de 1 a 2 das hbiles para completar [address]  Los precios de los medicamentos varan con frecuencia dependiendo del Environmental consultant de dnde se surte la receta y alguna farmacias pueden ofrecer precios ms baratos.  El sitio web www.goodrx.com tiene cupones para medicamentos de Health and safety inspector. Los precios aqu no tienen en cuenta lo que podra costar con la ayuda del seguro (puede ser ms barato con su seguro), pero el sitio web puede darle el precio si no utiliz Tourist information centre manager.  - Puede imprimir el cupn correspondiente y llevarlo con su receta a la farmacia.  - Tambin puede pasar por nuestra oficina durante el horario de atencin regular y Education officer, museum una tarjeta de cupones de GoodRx.  - Si necesita que su receta se enve  electrnicamente a una farmacia diferente, informe a nuestra oficina a travs de MyChart de [place] o por telfono llamando al [phone] y presione la opcin 4.

## 2023-07-01 NOTE — Progress Notes (Signed)
   Follow-Up Visit   Subjective  Bethany Aguilar is a 32 y.o. female who presents for the following: Suture removal  Pathology showed No residual dysplastic nevus, margins free, of the left mid back 3.0 cm lat to spine.  The following portions of the chart were reviewed this encounter and updated as appropriate: medications, allergies, medical history  Review of Systems:  No other skin or systemic complaints except as noted in HPI or Assessment and Plan.  Objective  Well appearing patient in no apparent distress; mood and affect are within normal limits.  Areas Examined: Back  Relevant physical exam findings are noted in the Assessment and Plan.    Assessment & Plan    Encounter for Removal of Sutures - Incision site is clean, dry and intact. - Wound cleansed, sutures removed, wound cleansed and steri strips applied.  - Discussed pathology results showing No residual dysplastic nevus, margins free, of the left mid back 3.0 cm lat to spine - Patient advised to keep steri-strips dry until they fall off. - Scars remodel for a full year. - Once steri-strips fall off, patient can apply over-the-counter silicone scar cream once to twice a day to help with scar remodeling if desired. - Patient advised to call with any concerns or if they notice any new or changing lesions.  Return as scheduled, for TBSE, Hx Dysplastic Nevus.  Wendee Beavers, CMA, am acting as scribe for Armida Sans, MD .   Documentation: I have reviewed the above documentation for accuracy and completeness, and I agree with the above.  Armida Sans, MD

## 2023-07-08 ENCOUNTER — Encounter: Payer: Self-pay | Admitting: Dermatology

## 2023-08-19 ENCOUNTER — Encounter: Payer: BC Managed Care – PPO | Admitting: Advanced Practice Midwife

## 2023-08-19 ENCOUNTER — Telehealth: Payer: Self-pay

## 2023-08-19 NOTE — Telephone Encounter (Signed)
Pt calling; has appt coming up; trying to decide if hsb needs to come; will an u/s be done at that appt?  Adv u/s will not be done at that time.  Pt reassured.

## 2023-08-21 ENCOUNTER — Encounter: Payer: Self-pay | Admitting: Licensed Practical Nurse

## 2023-08-21 ENCOUNTER — Ambulatory Visit: Payer: BC Managed Care – PPO | Admitting: Licensed Practical Nurse

## 2023-08-21 VITALS — BP 129/73 | HR 77 | Wt 169.5 lb

## 2023-08-21 DIAGNOSIS — Z3201 Encounter for pregnancy test, result positive: Secondary | ICD-10-CM

## 2023-08-21 DIAGNOSIS — N912 Amenorrhea, unspecified: Secondary | ICD-10-CM

## 2023-08-21 LAB — POCT URINE PREGNANCY: Preg Test, Ur: POSITIVE — AB

## 2023-08-21 NOTE — Progress Notes (Signed)
Pregnancy Confirmation Visit  SUBJECTIVE  Bethany Aguilar is a 32 y.o. G0P0000  who presents for evaluation of amenorrhea and possible pregnancy. Patient's last menstrual period was 07/18/2023 (exact date). and was Normal. She reports regular periods every 28 days lasting about 5 days with a "mild" flow Pregnancy is desired. Current symptoms include breast tenderness and cramping, headaches. She had a Positive UPT on November 30  Medical hx Denies pertinent medical hx  OB GYN G1P0 PAP 2020 WNL   Social: Works at [employer] with her husband, Richardson Dopp, feels safe Exercise: running, dancing and strength training  Denies tobacco, alcohol or illicit drug use    Review of Systems Pertinent items are noted in HPI.  OBJECTIVE BP 129/73   Pulse 77   Wt 169 lb 8 oz (76.9 kg)   LMP 07/18/2023 (Exact Date)   BMI 28.21 kg/m  Body mass index is 28.21 kg/m.  alert, well appearing, and in no distress and oriented to person, place, and time  Lab Review Urine hCG: positive  ASSESSMENT Amenorrhea.  Presumed pregnancy at 4wks 6 days with Estimated Date of Delivery: April 23, 2024   PLAN  Encouraged a well-balanced diet, rest, hydration, prenatal vitamins, and walking for exercise. Counseled to avoid alcohol, tobacco, and recreational drugs and to minimize caffeine intake. Safe medications list given. Discussed non-pharmacologic relief measures for nausea. Oriented to [facility]. Genetic screening options were discussed.  A dating [name] was ordered. Return in 5 for labs, NOB physical, and genetic screening if desired.    Carie Caddy, CNM  [facility]  08/21/23  4:49 PM

## 2023-09-11 ENCOUNTER — Other Ambulatory Visit: Payer: BC Managed Care – PPO

## 2023-09-22 ENCOUNTER — Ambulatory Visit: Payer: BC Managed Care – PPO

## 2023-09-22 DIAGNOSIS — Z3A10 10 weeks gestation of pregnancy: Secondary | ICD-10-CM | POA: Diagnosis not present

## 2023-09-22 DIAGNOSIS — Z3687 Encounter for antenatal screening for uncertain dates: Secondary | ICD-10-CM

## 2023-09-22 DIAGNOSIS — N912 Amenorrhea, unspecified: Secondary | ICD-10-CM

## 2023-09-26 ENCOUNTER — Telehealth: Payer: BC Managed Care – PPO

## 2023-09-26 DIAGNOSIS — Z3401 Encounter for supervision of normal first pregnancy, first trimester: Secondary | ICD-10-CM

## 2023-09-26 DIAGNOSIS — O3680X0 Pregnancy with inconclusive fetal viability, not applicable or unspecified: Secondary | ICD-10-CM

## 2023-09-26 DIAGNOSIS — Z3A10 10 weeks gestation of pregnancy: Secondary | ICD-10-CM

## 2023-10-06 ENCOUNTER — Telehealth: Payer: BC Managed Care – PPO

## 2023-10-06 VITALS — Ht 65.0 in | Wt 163.0 lb

## 2023-10-06 DIAGNOSIS — Z3689 Encounter for other specified antenatal screening: Secondary | ICD-10-CM | POA: Diagnosis not present

## 2023-10-06 DIAGNOSIS — Z3401 Encounter for supervision of normal first pregnancy, first trimester: Secondary | ICD-10-CM | POA: Insufficient documentation

## 2023-10-06 DIAGNOSIS — Z3491 Encounter for supervision of normal pregnancy, unspecified, first trimester: Secondary | ICD-10-CM

## 2023-10-06 DIAGNOSIS — Z3490 Encounter for supervision of normal pregnancy, unspecified, unspecified trimester: Secondary | ICD-10-CM | POA: Insufficient documentation

## 2023-10-06 NOTE — Progress Notes (Signed)
New OB Intake  I connected with  Bethany Aguilar on 10/06/23 at 10:15 AM EST by MyChart Video Visit and verified that I am speaking with the correct person using two identifiers. Nurse is located at [facility] and pt is located at home.  I discussed the limitations, risks, security and privacy concerns of performing an evaluation and management service by telephone and the availability of in person appointments. I also discussed with the patient that there may be a patient responsible charge related to this service. The patient expressed understanding and agreed to proceed.  I explained I am completing New OB Intake today. We discussed her EDD of 04/23/2024 that is based on LMP of 07/18/2023. Pt is G1/P0000. I reviewed her allergies, medications, Medical/Surgical/OB history, and appropriate screenings. There are no cats in the home in the home. Based on history, this is a/an pregnancy uncomplicated . Her obstetrical history is significant for  no complications .  Patient Active Problem List   Diagnosis Date Noted   Contraceptive management 10/03/2017    Concerns addressed today  Delivery Plans:  Plans to deliver at [facility]  Anatomy US Explained second scheduled [name] will be around 19 weeks.   Labs Discussed Avelina Laine genetic screening with patient. Patient desires genetic testing to be drawn at new OB physical visit. Discussed possible labs to be drawn at new OB physical appointment.  COVID Vaccine Patient has had COVID vaccine.  Patient declines Flu Vaccine  Social Determinants of Health Food Insecurity: denies food insecurity WIC Referral: Patient is not interested in referral to [facility].  Transportation: Patient denies transportation needs. Childcare: Discussed no children allowed at ultrasound appointments.   First visit review I reviewed new OB appt with pt. I explained she will have ob bloodwork and her pap smear is UTD and pelvic exam if indicated.  Explained pt will be seen by Carie Caddy, CNM at first visit; encounter routed to appropriate provider.     Santiago Bumpers, CMA [facility]

## 2023-10-06 NOTE — Patient Instructions (Signed)
Morning Sickness  Morning sickness is when you feel like you may vomit (feel nauseous) during pregnancy. Sometimes, you may vomit. Morning sickness most often happens in the morning, but it can also happen at any time of the day. Some women may have morning sickness that makes them vomit all the time. This is a more serious problem that needs treatment. What are the causes? The cause of this condition is not known. What increases the risk? You had vomiting or a feeling like you may vomit before your pregnancy. You had morning sickness in another pregnancy. You are pregnant with more than one baby, such as twins. What are the signs or symptoms? Feeling like you may vomit. Vomiting. How is this treated? Treatment is usually not needed for this condition. You may only need to change what you eat. In some cases, your doctor may give you some things to take for your condition. These include: Vitamin B6 supplements. Medicines to treat the feeling that you may vomit. Ginger. Follow these instructions at home: Medicines Take over-the-counter and prescription medicines only as told by your doctor. Do not take any medicines until you talk with your doctor about them first. Take multivitamins before you get pregnant. These can stop or lessen the symptoms of morning sickness. Eating and drinking Eat dry toast or crackers before getting out of bed. Eat 5 or 6 small meals a day. Eat dry and bland foods like rice and baked potatoes. Do not eat greasy, fatty, or spicy foods. Have someone cook for you if the smell of food causes you to vomit or to feel like you may vomit. If you feel like you may vomit after taking prenatal vitamins, take them at night or with a snack. Eat protein foods when you need a snack. Nuts, yogurt, and cheese are good choices. Drink fluids throughout the day. Try ginger ale made with real ginger, ginger tea made from fresh grated ginger, or ginger candies. General  instructions Do not smoke or use any products that contain nicotine or tobacco. If you need help quitting, ask your doctor. Use an air purifier to keep the air in your house free of smells. Get lots of fresh air. Try to avoid smells that make you feel sick. Try wearing an acupressure wristband. This is a wristband that is used to treat seasickness. Try a treatment called acupuncture. In this treatment, a doctor puts needles into certain areas of your body to make you feel better. Contact a doctor if: You need medicine to feel better. You feel dizzy or light-headed. You are losing weight. Get help right away if: The feeling that you may vomit will not go away, or you cannot stop vomiting. You faint. You have very bad pain in your belly. Summary Morning sickness is when you feel like you may vomit (feel nauseous) during pregnancy. You may feel sick in the morning, but you can feel this way at any time of the day. Making some changes to what you eat may help your symptoms go away. This information is not intended to replace advice given to you by your health care provider. Make sure you discuss any questions you have with your health care provider. Document Revised: 04/17/2020 Document Reviewed: 03/27/2020 Elsevier Patient Education  2024 [employer].   The first trimester of pregnancy starts on the first day of your last menstrual period until the end of week 12. This is also called months 1 through 3 of pregnancy. Body changes during your first  trimester Your body goes through many changes during pregnancy. The changes usually return to normal after your baby is born. Physical changes You may gain or lose weight. Your breasts may grow larger and hurt. The area around your nipples may get darker. Dark spots or blotches may develop on your face. You may have changes in your hair. Health changes You may feel like you might vomit (nauseous), and you may vomit. You may have  heartburn. You may have headaches. You may have trouble pooping (constipation). Your gums may bleed. Other changes You may get tired easily. You may pee (urinate) more often. Your menstrual periods will stop. You may not feel hungry. You may want to eat certain kinds of food. You may have changes in your emotions from day to day. You may have more dreams. Follow these instructions at home: Medicines Take over-the-counter and prescription medicines only as told by your doctor. Some medicines are not safe during pregnancy. Take a prenatal vitamin that contains at least 600 micrograms (mcg) of folic acid. Eating and drinking Eat healthy meals that include: Fresh fruits and vegetables. Whole grains. Good sources of protein, such as meat, eggs, or tofu. Low-fat dairy products. Avoid raw meat and unpasteurized juice, milk, and cheese. If you feel like you may vomit, or you vomit: Eat 4 or 5 small meals a day instead of 3 large meals. Try eating a few soda crackers. Drink liquids between meals instead of during meals. You may need to take these actions to prevent or treat trouble pooping: Drink enough fluids to keep your pee (urine) pale yellow. Eat foods that are high in fiber. These include beans, whole grains, and fresh fruits and vegetables. Limit foods that are high in fat and sugar. These include fried or sweet foods. Activity Exercise only as told by your doctor. Most people can do their usual exercise routine during pregnancy. Stop exercising if you have cramps or pain in your lower belly (abdomen) or low back. Do not exercise if it is too hot or too humid, or if you are in a place of great height (high altitude). Avoid heavy lifting. If you choose to, you may have sex unless your doctor tells you not to. Relieving pain and discomfort Wear a good support bra if your breasts are sore. Rest with your legs raised (elevated) if you have leg cramps or low back pain. If you have  bulging veins (varicose veins) in your legs: Wear support hose as told by your doctor. Raise your feet for 15 minutes, 3-4 times a day. Limit salt in your food. Safety Wear your seat belt at all times when you are in a car. Talk with your doctor if someone is hurting you or yelling at you. Talk with your doctor if you are feeling sad or have thoughts of hurting yourself. Lifestyle Do not use hot tubs, steam rooms, or saunas. Do not douche. Do not use tampons or scented sanitary pads. Do not use herbal medicines, illegal drugs, or medicines that are not approved by your doctor. Do not drink alcohol. Do not smoke or use any products that contain nicotine or tobacco. If you need help quitting, ask your doctor. Avoid cat litter boxes and soil that is used by cats. These carry germs that can cause harm to the baby and can cause a loss of your baby by miscarriage or stillbirth. General instructions Keep all follow-up visits. This is important. Ask for help if you need counseling or if you need  help with nutrition. Your doctor can give you advice or tell you where to go for help. Visit your dentist. At home, brush your teeth with a soft toothbrush. Floss gently. Write down your questions. Take them to your prenatal visits. Where to find more information American Pregnancy Association: americanpregnancy.org [employer]: www.acog.org Office on Women's Health: [employer] Contact a doctor if: You are dizzy. You have a fever. You have mild cramps or pressure in your lower belly. You have a nagging pain in your belly area. You continue to feel like you may vomit, you vomit, or you have watery poop (diarrhea) for 24 hours or longer. You have a bad-smelling fluid coming from your vagina. You have pain when you pee. You are exposed to a disease that spreads from person to person, such as chickenpox, measles, Zika virus, HIV, or hepatitis. Get  help right away if: You have spotting or bleeding from your vagina. You have very bad belly cramping or pain. You have shortness of breath or chest pain. You have any kind of injury, such as from a fall or a car crash. You have new or increased pain, swelling, or redness in an arm or leg. Summary The first trimester of pregnancy starts on the first day of your last menstrual period until the end of week 12 (months 1 through 3). Eat 4 or 5 small meals a day instead of 3 large meals. Do not smoke or use any products that contain nicotine or tobacco. If you need help quitting, ask your doctor. Keep all follow-up visits. This information is not intended to replace advice given to you by your health care provider. Make sure you discuss any questions you have with your health care provider. Document Revised: 02/09/2020 Document Reviewed: 12/16/2019 Elsevier Patient Education  2024 [employer] Commonly Asked Questions During Pregnancy  Cats: A parasite can be excreted in cat feces.  To avoid exposure you need to have another person empty the little box.  If you must empty the litter box you will need to wear gloves.  Wash your hands after handling your cat.  This parasite can also be found in raw or undercooked meat so this should also be avoided.  Colds, Sore Throats, Flu: Please check your medication sheet to see what you can take for symptoms.  If your symptoms are unrelieved by these medications please call the office.  Dental Work: Most any dental work Agricultural consultant recommends is permitted.  X-rays should only be taken during the first trimester if absolutely necessary.  Your abdomen should be shielded with a lead apron during all x-rays.  Please notify your provider prior to receiving any x-rays.  Novocaine is fine; gas is not recommended.  If your dentist requires a note from [name] prior to dental work please call the office and we will provide one for you.  Exercise: Exercise is an important  part of staying healthy during your pregnancy.  You may continue most exercises you were accustomed to prior to pregnancy.  Later in your pregnancy you will most likely notice you have difficulty with activities requiring balance like riding a bicycle.  It is important that you listen to your body and avoid activities that put you at a higher risk of falling.  Adequate rest and staying well hydrated are a must!  If you have questions about the safety of specific activities ask your provider.    Exposure to Children with illness: Try to avoid obvious exposure;  report any symptoms to [name] when noted,  If you have chicken pos, red measles or mumps, you should be immune to these diseases.   Please do not take any vaccines while pregnant unless you have checked with your OB provider.  Fetal Movement: After 28 weeks we recommend you do "kick counts" twice daily.  Lie or sit down in a calm quiet environment and count your baby movements "kicks".  You should feel your baby at least 10 times per hour.  If you have not felt 10 kicks within the first hour get up, walk around and have something sweet to eat or drink then repeat for an additional hour.  If count remains less than 10 per hour notify your provider.  Fumigating: Follow your pest control agent's advice as to how long to stay out of your home.  Ventilate the area well before re-entering.  Hemorrhoids:   Most over-the-counter preparations can be used during pregnancy.  Check your medication to see what is safe to use.  It is important to use a stool softener or fiber in your diet and to drink lots of liquids.  If hemorrhoids seem to be getting worse please call the office.   Hot Tubs:  Hot tubs Jacuzzis and saunas are not recommended while pregnant.  These increase your internal body temperature and should be avoided.  Intercourse:  Sexual intercourse is safe during pregnancy as long as you are comfortable, unless otherwise advised by your provider.   Spotting may occur after intercourse; report any bright red bleeding that is heavier than spotting.  Labor:  If you know that you are in labor, please go to the hospital.  If you are unsure, please call the office and let us help you decide what to do.  Lifting, straining, etc:  If your job requires heavy lifting or straining please check with your provider for any limitations.  Generally, you should not lift items heavier than that you can lift simply with your hands and arms (no back muscles)  Painting:  Paint fumes do not harm your pregnancy, but may make you ill and should be avoided if possible.  Latex or water based paints have less odor than oils.  Use adequate ventilation while painting.  Permanents & Hair Color:  Chemicals in hair dyes are not recommended as they cause increase hair dryness which can increase hair loss during pregnancy.  " Highlighting" and permanents are allowed.  Dye may be absorbed differently and permanents may not hold as well during pregnancy.  Sunbathing:  Use a sunscreen, as skin burns easily during pregnancy.  Drink plenty of fluids; avoid over heating.  Tanning Beds:  Because their possible side effects are still unknown, tanning beds are not recommended.  Ultrasound Scans:  Routine ultrasounds are performed at approximately 20 weeks.  You will be able to see your baby's general anatomy an if you would like to know the gender this can usually be determined as well.  If it is questionable when you conceived you may also receive an ultrasound early in your pregnancy for dating purposes.  Otherwise ultrasound exams are not routinely performed unless there is a medical necessity.  Although you can request a scan we ask that you pay for it when conducted because insurance does not cover " patient request" scans.  Work: If your pregnancy proceeds without complications you may work until your due date, unless your physician or employer advises otherwise.  Round Ligament  Pain/Pelvic Discomfort:  Sharp, shooting  pains not associated with bleeding are fairly common, usually occurring in the second trimester of pregnancy.  They tend to be worse when standing up or when you remain standing for long periods of time.  These are the result of pressure of certain pelvic ligaments called "round ligaments".  Rest, Tylenol and heat seem to be the most effective relief.  As the womb and fetus grow, they rise out of the pelvis and the discomfort improves.  Please notify the office if your pain seems different than that described.  It may represent a more serious condition.   Common Medications Safe in Pregnancy  Acne:      Constipation:  Benzoyl Peroxide     Colace  Clindamycin      Dulcolax Suppository  Topica Erythromycin     Fibercon  Salicylic Acid      Metamucil         Miralax AVOID:        Senakot   Accutane    Cough:  Retin-A       Cough Drops  Tetracycline      Phenergan w/ Codeine if Rx  Minocycline      Robitussin (Plain & DM)  Antibiotics:     Crabs/Lice:  Ceclor       RID  Cephalosporins    AVOID:  E-Mycins      Kwell  Keflex  Macrobid/Macrodantin   Diarrhea:  Penicillin      Kao-Pectate  Zithromax      Imodium AD         PUSH FLUIDS AVOID:       Cipro     Fever:  Tetracycline      Tylenol (Regular or Extra  Minocycline       Strength)  Levaquin      Extra Strength-Do not          Exceed 8 tabs/24 hrs Caffeine:        200mg /day (equiv. To 1 cup of coffee or  approx. 3 12 oz sodas)         Gas: Cold/Hayfever:       Gas-X  Benadryl      Mylicon  Claritin       Phazyme  **Claritin-D        Chlor-Trimeton    Headaches:  Dimetapp      ASA-Free Excedrin  Drixoral-Non-Drowsy     Cold Compress  Mucinex (Guaifenasin)     Tylenol (Regular or Extra  Sudafed/Sudafed-12 Hour     Strength)  **Sudafed PE Pseudoephedrine   Tylenol Cold & Sinus     Vicks Vapor Rub  Zyrtec  **AVOID if Problems With Blood Pressure         Heartburn: Avoid  lying down for at least 1 hour after meals  Aciphex      Maalox     Rash:  Milk of Magnesia     Benadryl    Mylanta       1% Hydrocortisone Cream  Pepcid  Pepcid Complete   Sleep Aids:  Prevacid      Ambien   Prilosec       Benadryl  Rolaids       Chamomile Tea  Tums (Limit 4/day)     Unisom         Tylenol PM         Warm milk-add vanilla or  Hemorrhoids:       Sugar for taste  Anusol/Anusol H.C.  (RX: Analapram 2.5%)  Sugar Substitutes:  Hydrocortisone OTC     Ok in moderation  Preparation H      Tucks        Vaseline lotion applied to tissue with wiping    Herpes:     Throat:  Acyclovir      Oragel  Famvir  Valtrex     Vaccines:         Flu Shot Leg Cramps:       *Gardasil  Benadryl      Hepatitis A         Hepatitis B Nasal Spray:       Pneumovax  Saline Nasal Spray     Polio Booster         Tetanus Nausea:       Tuberculosis test or PPD  Vitamin B6 25 mg TID   AVOID:    Dramamine      *Gardasil  Emetrol       Live Poliovirus  Ginger Root 250 mg QID    MMR (measles, mumps &  High Complex Carbs @ Bedtime    rebella)  Sea Bands-Accupressure    Varicella (Chickenpox)  Unisom 1/2 tab TID     *No known complications           If received before Pain:         Known pregnancy;   Darvocet       Resume series after  Lortab        Delivery  Percocet    Yeast:   Tramadol      Femstat  Tylenol 3      Gyne-lotrimin  Ultram       Monistat  Vicodin           MISC:         All Sunscreens           Hair Coloring/highlights          Insect Repellant's          (Including DEET)         Mystic Tans

## 2023-10-14 ENCOUNTER — Ambulatory Visit (INDEPENDENT_AMBULATORY_CARE_PROVIDER_SITE_OTHER): Payer: BC Managed Care – PPO | Admitting: Licensed Practical Nurse

## 2023-10-14 ENCOUNTER — Other Ambulatory Visit (HOSPITAL_COMMUNITY)
Admission: RE | Admit: 2023-10-14 | Discharge: 2023-10-14 | Disposition: A | Discharge status: Home / Self Care | Payer: BC Managed Care – PPO | Source: Ambulatory Visit | Attending: Licensed Practical Nurse | Admitting: Licensed Practical Nurse

## 2023-10-14 ENCOUNTER — Encounter: Payer: Self-pay | Admitting: Licensed Practical Nurse

## 2023-10-14 VITALS — BP 135/70 | HR 71 | Wt 173.0 lb

## 2023-10-14 DIAGNOSIS — Z113 Encounter for screening for infections with a predominantly sexual mode of transmission: Secondary | ICD-10-CM | POA: Insufficient documentation

## 2023-10-14 DIAGNOSIS — Z3401 Encounter for supervision of normal first pregnancy, first trimester: Secondary | ICD-10-CM | POA: Diagnosis not present

## 2023-10-14 DIAGNOSIS — Z3402 Encounter for supervision of normal first pregnancy, second trimester: Secondary | ICD-10-CM | POA: Insufficient documentation

## 2023-10-14 DIAGNOSIS — Z3A12 12 weeks gestation of pregnancy: Secondary | ICD-10-CM

## 2023-10-14 NOTE — Progress Notes (Signed)
NEW OB HISTORY AND PHYSICAL  SUBJECTIVE:       Bethany Aguilar is a 33 y.o. G16P0000 female, Patient's last menstrual period was 07/18/2023 (exact date)., Estimated Date of Delivery: 04/23/24, [redacted]w[redacted]d, presents today for establishment of Prenatal Care. She reports mild nausea; feels it is manageable at this time.   Jacie and her partner Richardson Dopp are excited for this pregnancy!  Social history: see pregnancy confirmation visit note.   Indications for ASA therapy (per uptodate) One of the following: Previous pregnancy with preeclampsia, especially early onset and with an adverse outcome No Multifetal gestation No Chronic hypertension No Type 1 or 2 diabetes mellitus No Chronic kidney disease No Autoimmune disease (antiphospholipid syndrome, systemic lupus erythematosus) No  Two or more of the following: Nulliparity Yes Obesity (body mass index >30 kg/m2) No Family history of preeclampsia in mother or sister No Age >=35 years No Sociodemographic characteristics (African American race, low socioeconomic level) No Personal risk factors (eg, previous pregnancy with low birth weight or small for gestational age infant, previous adverse pregnancy outcome [eg, stillbirth], interval >10 years between pregnancies) No   Gynecologic History Patient's last menstrual period was 07/18/2023 (exact date).  Contraception: none Last Pap: 05/20/2023. Results were: normal  Obstetric History OB History  Gravida Para Term Preterm AB Living  1 0 0 0 0 0  SAB IAB Ectopic Multiple Live Births  0 0 0 0 0    # Outcome Date GA Lbr Len/2nd Weight Sex Type Anes PTL Lv  1 Current             Past Medical History:  Diagnosis Date   Contraceptive management    Dysplastic nevus 05/02/2016   mid back spinal below bra   Dysplastic nevus 11/06/2016   left low back lat flank   Dysplastic nevus 05/14/2018   right low back 4.0 cm lat to spine sup, right low back 4.0 cm lat to spine inf, right upper back 3.0 cm  lat to spine   Dysplastic nevus 09/14/2018   left med breast, right lat epigastric   Dysplastic nevus 06/07/2020   R ant axillary fold, moderat atypia   Dysplastic nevus 06/07/2020   L scapula, moderate to severe atypia - shave removal    Dysplastic nevus 06/07/2020   L mid back, 6.0cm lat to spine, severe excised 08/08/20   Dysplastic nevus 06/07/2020   R sup medial scapula, moderate to severe atypia - shave removal    Dysplastic nevus 06/07/2020   R lower back, moderate atypia   Dysplastic nevus 08/01/2021   Right upper back paraspinal - Moderate   Dysplastic nevus 08/01/2021   Left upper back medial to mid scapula 4.0 cm lat to spine - Moderate to severe - Recheck on follow up   Dysplastic nevus 12/09/2022   R upper back paraspinal - mild   Dysplastic nevus 06/11/2023   L mid back 3.0cm lat to spine - severe, Excised 06/24/2023   Dysplastic nevus 06/11/2023   L upper back 3.0cm lat to spine   Dysplastic nevus 06/11/2023   R mid back lat infra scapular - moderate    Past Surgical History:  Procedure Laterality Date   SKIN BIOPSY     back, stomach and leg; atypical cells    Current Outpatient Medications on File Prior to Visit  Medication Sig Dispense Refill   Prenatal Vit-Fe Fumarate-FA (PRENATAL MULTIVITAMIN) TABS tablet Take 1 tablet by mouth daily at 12 noon.     Doxycycline, Rosacea, (ORACEA) 40  MG CPDR Take 1 tablet by mouth daily. 1 po qd with food and drink (Patient not taking: Reported on 08/21/2023) 30 capsule 6   mupirocin ointment (BACTROBAN) 2 % Apply to aa QD (Patient not taking: Reported on 08/21/2023) 22 g 0   No current facility-administered medications on file prior to visit.    No Known Allergies  Social History   Socioeconomic History   Marital status: Married    Spouse name: Richardson Dopp   Number of children: 0   Years of education: 16   Highest education level: Bachelor's degree (e.g., BA, AB, BS)  Occupational History   Occupation: MARKETING/SOCIAL  MEDIA   Occupation: Scientist, product/process development: [employer]    Industry: Education   Tobacco Use   Smoking status: Never    Passive exposure: Never   Smokeless tobacco: Never  Vaping Use   Vaping status: Never Used  Substance and Sexual Activity   Alcohol use: Not Currently    Comment: 1-2/month   Drug use: No   Sexual activity: Yes    Partners: Male  Other Topics Concern   Not on file  Social History Narrative   Not on file   Social Drivers of Health   Financial Resource Strain: Low Risk  (03/03/2023)   Overall Financial Resource Strain (CARDIA)    Difficulty of Paying Living Expenses: Not hard at all  Food Insecurity: No Food Insecurity (10/06/2023)   Hunger Vital Sign    Worried About Running Out of Food in the Last Year: Never true    Ran Out of Food in the Last Year: Never true  Transportation Needs: No Transportation Needs (10/06/2023)   PRAPARE - Administrator, Civil Service (Medical): No    Lack of Transportation (Non-Medical): No  Physical Activity: Sufficiently Active (03/03/2023)   Exercise Vital Sign    Days of Exercise per Week: 6 days    Minutes of Exercise per Session: 30 min  Stress: No Stress Concern Present (10/06/2023)   [employer] of Occupational Health - Occupational Stress Questionnaire    Feeling of Stress : Not at all  Social Connections: Moderately Integrated (03/03/2023)   Social Connection and Isolation Panel [NHANES]    Frequency of Communication with Friends and Family: More than three times a week    Frequency of Social Gatherings with Friends and Family: Once a week    Attends Religious Services: More than 4 times per year    Active Member of [employer] or Organizations: No    Attends Engineer, structural: Not on file    Marital Status: Married  Catering manager Violence: Not At Risk (10/06/2023)   Humiliation, Afraid, Rape, and Kick questionnaire    Fear of Current or Ex-Partner: No    Emotionally Abused: No     Physically Abused: No    Sexually Abused: No    Family History  Problem Relation Age of Onset   Skin cancer Paternal Grandmother    Diabetes Maternal Aunt    Throat cancer Maternal Aunt 60   Diabetes Maternal Grandfather    Skin cancer Maternal Grandfather    Breast cancer Neg Hx    Ovarian cancer Neg Hx     The following portions of the patient's history were reviewed and updated as appropriate: allergies, current medications, past OB history, past medical history, past surgical history, past family history, past social history, and problem list.  Constitutional: Denied constitutional symptoms, night sweats, recent illness, fatigue, fever, insomnia  and weight loss.  Eyes: Denied eye symptoms, eye pain, photophobia, vision change and visual disturbance.  Ears/Nose/Throat/Neck: Denied ear, nose, throat or neck symptoms, hearing loss, nasal discharge, sinus congestion and sore throat.  Cardiovascular: Denied cardiovascular symptoms, arrhythmia, chest pain/pressure, edema, exercise intolerance, orthopnea and palpitations.  Respiratory: Denied pulmonary symptoms, asthma, pleuritic pain, productive sputum, cough, dyspnea and wheezing.  Gastrointestinal: Denied gastro-esophageal reflux, melena, vomiting. +nausea  Genitourinary: Denied genitourinary symptoms including symptomatic vaginal discharge, pelvic relaxation issues, and urinary complaints.  Musculoskeletal: Denied musculoskeletal symptoms, stiffness, swelling, muscle weakness and myalgia.   Dermatologic: Denied dermatology symptoms, rash and scar.  Neurologic: Denied neurology symptoms, dizziness, headache, neck pain and syncope.  Psychiatric: Denied psychiatric symptoms, anxiety and depression.  Endocrine: Denied endocrine symptoms including hot flashes and night sweats.     OBJECTIVE: Initial Physical Exam (New OB)  GENERAL APPEARANCE: alert, well appearing, in no apparent distress, oriented to person, place and time THYROID:  no thyromegaly or masses present LUNGS: clear to auscultation, no wheezes, rales or rhonchi, symmetric air entry HEART: regular rate and rhythm, no murmurs ABDOMEN: soft, nontender, nondistended, no abnormal masses, no epigastric pain EXTREMITIES: no redness or tenderness in the calves or thighs SKIN: normal coloration and turgor, no rashes NEUROLOGIC: alert, oriented, normal speech, no focal findings or movement disorder noted PELVIC EXAM deferred   Vitals:   10/14/23 1019  BP: 135/70  Pulse: 71  Weight: 78.5 kg  BMI (Calculated): 28.79  FHR 155 via doppler   ASSESSMENT: Normal pregnancy   PLAN: Routine prenatal care. We discussed an overview of prenatal care and when to call. Reviewed diet, exercise, and weight gain recommendations in pregnancy. Discussed benefits of breastfeeding and lactation resources at [facility]. I reviewed labs and answered all questions.  1. Encounter for supervision of normal first pregnancy in second trimester (Primary) - NOB Panel - Culture, OB Urine - Monitor Drug Profile 14(MW) - Nicotine screen, urine - Urinalysis, Routine w reflex microscopic - Cervicovaginal ancillary only - Hgb Fractionation Cascade - MaterniT21 PLUS Core - US OB Comp + 14 Wk; Future  2. [redacted] weeks gestation of pregnancy - NOB Panel - Culture, OB Urine - Monitor Drug Profile 14(MW) - Nicotine screen, urine - Urinalysis, Routine w reflex microscopic - Cervicovaginal ancillary only - Hgb Fractionation Cascade - MaterniT21 PLUS Core - US OB Comp + 14 Wk; Future  3. Screening examination for STD (sexually transmitted disease) - Cervicovaginal ancillary only   Eloise Levels, Student-MidWife  Carie Caddy, CNM present for all portions of care.

## 2023-10-15 LAB — URINALYSIS, ROUTINE W REFLEX MICROSCOPIC
Bilirubin, UA: NEGATIVE
Glucose, UA: NEGATIVE
Ketones, UA: NEGATIVE
Leukocytes,UA: NEGATIVE
Nitrite, UA: NEGATIVE
Protein,UA: NEGATIVE
RBC, UA: NEGATIVE
Specific Gravity, UA: 1.011 (ref 1.005–1.030)
Urobilinogen, Ur: 0.2 mg/dL (ref 0.2–1.0)
pH, UA: 7.5 (ref 5.0–7.5)

## 2023-10-15 LAB — CERVICOVAGINAL ANCILLARY ONLY
Bacterial Vaginitis (gardnerella): NEGATIVE
Candida Glabrata: NEGATIVE
Candida Vaginitis: NEGATIVE
Chlamydia: NEGATIVE
Comment: NEGATIVE
Comment: NEGATIVE
Comment: NEGATIVE
Comment: NEGATIVE
Comment: NEGATIVE
Comment: NORMAL
Neisseria Gonorrhea: NEGATIVE
Trichomonas: NEGATIVE

## 2023-10-16 LAB — NICOTINE SCREEN, URINE: Cotinine Ql Scrn, Ur: NEGATIVE ng/mL

## 2023-10-16 LAB — CBC/D/PLT+RPR+RH+ABO+RUBIGG...
Antibody Screen: NEGATIVE
Basophils Absolute: 0 10*3/uL (ref 0.0–0.2)
Basos: 0 %
EOS (ABSOLUTE): 0.1 10*3/uL (ref 0.0–0.4)
Eos: 1 %
HCV Ab: NONREACTIVE
HIV Screen 4th Generation wRfx: NONREACTIVE
Hematocrit: 42.1 % (ref 34.0–46.6)
Hemoglobin: 14.1 g/dL (ref 11.1–15.9)
Hepatitis B Surface Ag: NEGATIVE
Immature Grans (Abs): 0 10*3/uL (ref 0.0–0.1)
Immature Granulocytes: 0 %
Lymphocytes Absolute: 1.3 10*3/uL (ref 0.7–3.1)
Lymphs: 16 %
MCH: 29.9 pg (ref 26.6–33.0)
MCHC: 33.5 g/dL (ref 31.5–35.7)
MCV: 89 fL (ref 79–97)
Monocytes Absolute: 0.4 10*3/uL (ref 0.1–0.9)
Monocytes: 5 %
Neutrophils Absolute: 6.3 10*3/uL (ref 1.4–7.0)
Neutrophils: 78 %
Platelets: 242 10*3/uL (ref 150–450)
RBC: 4.72 x10E6/uL (ref 3.77–5.28)
RDW: 15.2 % (ref 11.7–15.4)
RPR Ser Ql: NONREACTIVE
Rh Factor: NEGATIVE
Rubella Antibodies, IGG: 2.1 {index} (ref >0.99)
Varicella zoster IgG: NONREACTIVE
WBC: 8.2 10*3/uL (ref 3.4–10.8)

## 2023-10-16 LAB — MONITOR DRUG PROFILE 14(MW)
Amphetamine Scrn, Ur: NEGATIVE ng/mL
BARBITURATE SCREEN URINE: NEGATIVE ng/mL
BENZODIAZEPINE SCREEN, URINE: NEGATIVE ng/mL
Buprenorphine, Urine: NEGATIVE ng/mL
CANNABINOIDS UR QL SCN: NEGATIVE ng/mL
Cocaine (Metab) Scrn, Ur: NEGATIVE ng/mL
Creatinine(Crt), U: 52.4 mg/dL (ref 20.0–300.0)
Fentanyl, Urine: NEGATIVE pg/mL
Meperidine Screen, Urine: NEGATIVE ng/mL
Methadone Screen, Urine: NEGATIVE ng/mL
OXYCODONE+OXYMORPHONE UR QL SCN: NEGATIVE ng/mL
Opiate Scrn, Ur: NEGATIVE ng/mL
Ph of Urine: 7.5 (ref 4.5–8.9)
Phencyclidine Qn, Ur: NEGATIVE ng/mL
Propoxyphene Scrn, Ur: NEGATIVE ng/mL
SPECIFIC GRAVITY: 1.009
Tramadol Screen, Urine: NEGATIVE ng/mL

## 2023-10-16 LAB — CULTURE, OB URINE

## 2023-10-16 LAB — HGB FRACTIONATION CASCADE
Hgb A2: 2.4 % (ref 1.8–3.2)
Hgb A: 97.6 % (ref 96.4–98.8)
Hgb F: 0 % (ref 0.0–2.0)
Hgb S: 0 %

## 2023-10-16 LAB — HCV INTERPRETATION

## 2023-10-16 LAB — URINE CULTURE, OB REFLEX

## 2023-10-17 ENCOUNTER — Encounter: Payer: Self-pay | Admitting: Licensed Practical Nurse

## 2023-10-19 LAB — MATERNIT 21 PLUS CORE, BLOOD
Fetal Fraction: 20
Result (T21): NEGATIVE
Trisomy 13 (Patau syndrome): NEGATIVE
Trisomy 18 (Edwards syndrome): NEGATIVE
Trisomy 21 (Down syndrome): NEGATIVE

## 2023-11-11 ENCOUNTER — Encounter: Payer: Self-pay | Admitting: Certified Nurse Midwife

## 2023-11-11 ENCOUNTER — Ambulatory Visit (INDEPENDENT_AMBULATORY_CARE_PROVIDER_SITE_OTHER): Payer: BC Managed Care – PPO | Admitting: Certified Nurse Midwife

## 2023-11-11 VITALS — BP 124/79 | HR 84 | Wt 178.6 lb

## 2023-11-11 DIAGNOSIS — Z3A16 16 weeks gestation of pregnancy: Secondary | ICD-10-CM

## 2023-11-11 NOTE — Patient Instructions (Signed)
 Round Ligament Pain  The round ligaments are a pair of cord-like tissues that help support the uterus. They can become a source of pain during pregnancy as the ligaments soften and stretch as the baby grows. The pain usually begins in the second trimester (13-28 weeks) of pregnancy, and should only last for a few seconds when it occurs. However, the pain can come and go until the baby is delivered. The pain does not cause harm to the baby. Round ligament pain is usually a short, sharp, and pinching pain, but it can also be a dull, lingering, and aching pain. The pain is felt in the lower side of the abdomen or in the groin. It usually starts deep in the groin and moves up to the outside of the hip area. The pain may happen when you: Suddenly change position, such as quickly going from a sitting to standing position. Do physical activity. Cough or sneeze. Follow these instructions at home: Managing pain  When the pain starts, relax. Then, try any of these methods to help with the pain: Sit down. Flex your knees up to your abdomen. Lie on your side with one pillow under your abdomen and another pillow between your legs. Sit in a warm bath for 15-20 minutes or until the pain goes away. General instructions Watch your condition for any changes. Move slowly when you sit down or stand up. Stop or reduce your physical activities if they cause pain. Avoid long walks if they cause pain. Take over-the-counter and prescription medicines only as told by your health care provider. Keep all follow-up visits. This is important. Contact a health care provider if: Your pain does not go away with treatment. You feel pain in your back that you did not have before. Your medicine is not helping. You have a fever or chills. You have nausea or vomiting. You have diarrhea. You have pain when you urinate. Get help right away if: You have pain that is a rhythmic, cramping pain similar to labor pains. Labor  pains are usually 2 minutes apart, last for about 1 minute, and involve a bearing down feeling or pressure in your pelvis. You have vaginal bleeding. These symptoms may represent a serious problem that is an emergency. Do not wait to see if the symptoms will go away. Get medical help right away. Call your local emergency services (911 in the [place] Do not drive yourself to the hospital. Summary Round ligament pain is felt in the lower abdomen or groin. This pain usually begins in the second trimester (13-28 weeks) and should only last for a few seconds when it occurs. You may notice the pain when you suddenly change position, when you cough or sneeze, or during physical activity. Relaxing, flexing your knees to your abdomen, lying on one side, or taking a warm bath may help to get rid of the pain. Contact your health care provider if the pain does not go away. This information is not intended to replace advice given to you by your health care provider. Make sure you discuss any questions you have with your health care provider. Document Revised: 11/15/2020 Document Reviewed: 11/15/2020 Elsevier Patient Education  2024 [employer].

## 2023-11-11 NOTE — Progress Notes (Signed)
 ROB , feeling good. Pt having some itching on her upper thighs. Discussed self help measures and waring signs for cholestasis. She verbalizes understanding. Reviewed round ligament pian and self help measure.  Discussed anatomy u/s next visit. Follow up 4 weeks or prn .

## 2023-11-24 ENCOUNTER — Other Ambulatory Visit: Payer: Self-pay | Admitting: Licensed Practical Nurse

## 2023-11-24 DIAGNOSIS — Z363 Encounter for antenatal screening for malformations: Secondary | ICD-10-CM

## 2023-11-24 DIAGNOSIS — Z3A12 12 weeks gestation of pregnancy: Secondary | ICD-10-CM

## 2023-11-24 DIAGNOSIS — Z3402 Encounter for supervision of normal first pregnancy, second trimester: Secondary | ICD-10-CM

## 2023-11-24 DIAGNOSIS — Z113 Encounter for screening for infections with a predominantly sexual mode of transmission: Secondary | ICD-10-CM

## 2023-11-25 ENCOUNTER — Ambulatory Visit (INDEPENDENT_AMBULATORY_CARE_PROVIDER_SITE_OTHER): Payer: BC Managed Care – PPO | Admitting: Obstetrics

## 2023-11-25 ENCOUNTER — Encounter: Payer: Self-pay | Admitting: Obstetrics

## 2023-11-25 ENCOUNTER — Other Ambulatory Visit (HOSPITAL_COMMUNITY)
Admission: RE | Admit: 2023-11-25 | Discharge: 2023-11-25 | Disposition: A | Discharge status: Home / Self Care | Source: Ambulatory Visit | Attending: Obstetrics | Admitting: Obstetrics

## 2023-11-25 ENCOUNTER — Ambulatory Visit: Payer: BC Managed Care – PPO

## 2023-11-25 VITALS — BP 131/74 | HR 80 | Wt 182.7 lb

## 2023-11-25 DIAGNOSIS — O26892 Other specified pregnancy related conditions, second trimester: Secondary | ICD-10-CM | POA: Insufficient documentation

## 2023-11-25 DIAGNOSIS — N898 Other specified noninflammatory disorders of vagina: Secondary | ICD-10-CM

## 2023-11-25 DIAGNOSIS — Z363 Encounter for antenatal screening for malformations: Secondary | ICD-10-CM

## 2023-11-25 DIAGNOSIS — Z3A18 18 weeks gestation of pregnancy: Secondary | ICD-10-CM

## 2023-11-25 DIAGNOSIS — Z362 Encounter for other antenatal screening follow-up: Secondary | ICD-10-CM

## 2023-11-25 NOTE — Assessment & Plan Note (Signed)
-  Anatomy US incomplete. F/u in 4 weeks -Swab collected for increased discharge -Anticipatory guidance about second trimester and fetal development -Discussed water birth options at [facility], limited availability of providers, tub rental and water birth class

## 2023-11-25 NOTE — Progress Notes (Signed)
    Return Prenatal Note   Assessment/Plan   Plan  33 y.o. G1P0000 at [redacted]w[redacted]d presents for follow-up OB visit. Reviewed prenatal record including previous visit note.  Supervision of normal pregnancy -Anatomy US incomplete. F/u in 4 weeks -Swab collected for increased discharge -Anticipatory guidance about second trimester and fetal development -Discussed water birth options at [facility], limited availability of providers, tub rental and water birth class   Orders Placed This Encounter  Procedures   US OB Follow Up    Standing Status:   Future    Expected Date:   12/26/2023    Expiration Date:   11/24/2024    Reason for Exam (SYMPTOM  OR DIAGNOSIS REQUIRED):   incomplete anatomy    Preferred Imaging Location?:   Internal   Return in about 4 weeks (around 12/23/2023).   Future Appointments  Date Time Provider Department Center  12/09/2023  8:15 AM Dominica Severin, CNM [facility] None  12/29/2023 10:15 AM [facility] [name] AOB[facility] None  12/29/2023 11:15 AM Glenetta Borg, CNM [facility]OB[facility] None  12/29/2023  4:00 PM Deirdre Evener, MD [facility] None    For next visit:  Routine prenatal care    Subjective   Bethany Aguilar is having more energy now in her second trimester. Her appetite is good and she is more active. She is starting to feel small movements. She is interested in water birth.  Movement: Present Contractions: Not present  Objective   Flow sheet Vitals: Pulse Rate: 80 BP: 131/74 Fetal Heart Rate (bpm): 147 Total weight gain: 19 lb 11.2 oz (8.936 kg)  General Appearance  No acute distress, well appearing, and well nourished Pulmonary   Normal work of breathing Neurologic   Alert and oriented to person, place, and time Psychiatric   Mood and affect within normal limits  Guadlupe Spanish, CNM 11/25/23 2:34 PM

## 2023-11-27 LAB — CERVICOVAGINAL ANCILLARY ONLY
Bacterial Vaginitis (gardnerella): NEGATIVE
Candida Glabrata: NEGATIVE
Candida Vaginitis: NEGATIVE
Comment: NEGATIVE
Comment: NEGATIVE
Comment: NEGATIVE

## 2023-12-01 ENCOUNTER — Encounter: Payer: Self-pay | Admitting: Obstetrics

## 2023-12-09 ENCOUNTER — Ambulatory Visit (INDEPENDENT_AMBULATORY_CARE_PROVIDER_SITE_OTHER): Payer: BC Managed Care – PPO | Admitting: Certified Nurse Midwife

## 2023-12-09 ENCOUNTER — Encounter: Payer: Self-pay | Admitting: Certified Nurse Midwife

## 2023-12-09 VITALS — BP 124/74 | HR 80 | Wt 186.5 lb

## 2023-12-09 DIAGNOSIS — Z3402 Encounter for supervision of normal first pregnancy, second trimester: Secondary | ICD-10-CM | POA: Diagnosis not present

## 2023-12-09 DIAGNOSIS — Z3A20 20 weeks gestation of pregnancy: Secondary | ICD-10-CM | POA: Diagnosis not present

## 2023-12-09 NOTE — Progress Notes (Signed)
    Return Prenatal Note   Subjective   33 y.o. G1P0000 at [redacted]w[redacted]d presents for this follow-up prenatal visit.  Patient feeling well, strongly desires option for waterbirth so is looking at transfer of care and delivery in [place], reviewed need for waterbirth class and encouraged transfer to practice that can more easily provide this option. Patient reports: Movement: Present Contractions: Not present  Objective   Flow sheet Vitals: Pulse Rate: 80 BP: 124/74 Fundal Height:  (@U ) Fetal Heart Rate (bpm): 150 Total weight gain: 23 lb 8 oz (10.7 kg)  General Appearance  No acute distress, well appearing, and well nourished Pulmonary   Normal work of breathing Neurologic   Alert and oriented to person, place, and time Psychiatric   Mood and affect within normal limits  Assessment/Plan   Plan  33 y.o. G1P0000 at [redacted]w[redacted]d presents for follow-up OB visit. Reviewed prenatal record including previous visit note.  Supervision of normal pregnancy Red flag symptoms reviewed. Follow up ultrasound for completion of anatomy scheduled 4/14.      No orders of the defined types were placed in this encounter.  Return in 4 weeks (on 01/06/2024) for ROB.   Future Appointments  Date Time Provider Department Center  12/29/2023 10:15 AM [facility] [name] AOB[facility] None  12/29/2023 11:15 AM Glenetta Borg, CNM [facility]OB[facility] None  12/29/2023  4:00 PM Deirdre Evener, MD ASC[facility] None  01/06/2024  8:15 AM Tresea Mall, CNM AOB-A[facility] None    For next visit:  continue with routine prenatal care     Dominica Severin, CNM  03/25/258:37 AM

## 2023-12-09 NOTE — Patient Instructions (Signed)
 Second Trimester of Pregnancy  The second trimester of pregnancy is from week 14 through week 27. This is months 4 through 6 of pregnancy. During the second trimester: Morning sickness is less or has stopped. You may have more energy. You may feel hungry more often. At this time, your unborn baby is growing very fast. At the end of the sixth month, the unborn baby may be up to 12 inches long and weigh about 1 pounds. You will likely start to feel the baby move between 16 and 20 weeks of pregnancy. Body changes during your second trimester Your body continues to change during this time. The changes usually go away after your baby is born. Physical changes You will gain more weight. Your belly will get bigger. You may begin to get stretch marks on your hips, belly, and breasts. Your breasts will keep growing and may hurt. You may get dark spots or blotches on your face. A dark line from your belly button to the pubic area may appear. This line is called linea nigra. Your hair may grow faster and get thicker. Health changes You may have headaches. You may have heartburn. You may pee more often. You may have swollen, bulging veins (varicose veins). You may have trouble pooping (constipation), or swollen veins in the butt that can itch or get painful (hemorrhoids). You may have back pain. This is caused by: Weight gain. Pregnancy hormones that are relaxing the joints in your pelvis. Follow these instructions at home: Medicines Talk to your health care provider if you're taking medicines. Ask if the medicines are safe to take during pregnancy. Your provider may change the medicines that you take. Do not take any medicines unless told to by your provider. Take a prenatal vitamin that has at least 600 micrograms (mcg) of folic acid. Do not use herbal medicines, illegal drugs, or medicines that are not approved by your provider. Eating and drinking While you're pregnant your body needs  extra food for your growing baby. Talk with your provider about what to eat while pregnant. Activity Most women are able to exercise during pregnancy. Exercises may need to change as your pregnancy goes on. Talk to your provider about your activities and exercise routines. Relieving pain and discomfort Wear a good, supportive bra if your breasts hurt. Rest with your legs raised if you have leg cramps or low back pain. Take warm sitz baths to soothe pain from hemorrhoids. Use hemorrhoid cream if your provider says it's okay. Do not douche. Do not use tampons or scented pads. Do not use hot tubs, steam rooms, or saunas. Safety Wear your seatbelt at all times when you're in a car. Talk to your provider if someone hits you, hurts you, or yells at you. Talk with your provider if you're feeling sad or have thoughts of hurting yourself. Lifestyle Certain things can be harmful while you're pregnant. It's best to avoid the following: Do not drink alcohol,smoke, vape, or use products with nicotine or tobacco in them. If you need help quitting, talk with your provider. Avoid cat litter boxes and soil used by cats. These things carry germs that can cause harm to your pregnancy and your baby. General instructions Keep all follow-up visits. It helps you and your unborn baby stay as healthy as possible. Write down your questions. Take them to your prenatal visits. Your provider will: Talk with you about your overall health. Give you advice or refer you to specialists who can help with different needs,  including: Prenatal education classes. Mental health and counseling. Foods and healthy eating. Ask for help if you need help with food. Where to find more information American Pregnancy Association: americanpregnancy.org [employer] of Obstetricians and Gynecologists: acog.org Office on [employer] Health: [employer].ca Contact a health care provider if: You have a headache that does not go away  when you take medicine. You have any of these problems: You can't eat or drink. You throw up or feel like you may throw up. You have watery poop (diarrhea) for 2 days or more. You have pain when you pee or your pee smells bad. You have been sick for 2 days or more and are not getting better. Contact your provider right away if: You have any of these coming from your vagina: Abnormal discharge. Bad-smelling fluid. Bleeding. Your baby is moving less than usual. You have contractions, belly cramping, or have pain in your pelvis or lower back. You have symptoms of high blood pressure or preeclampsia. These include: A severe, throbbing headache that does not go away. Sudden or extreme swelling of your face, hands, legs, or feet. Vision problems: You see spots. You have blurry vision. Your eyes are sensitive to light. If you can't reach the provider, go to an urgent care or emergency room. Get help right away if: You faint, become confused, or can't think clearly. You have chest pain or trouble breathing. You have any kind of injury, such as from a fall or a car crash. These symptoms may be an emergency. Call 911 right away. Do not wait to see if the symptoms will go away. Do not drive yourself to the hospital. This information is not intended to replace advice given to you by your health care provider. Make sure you discuss any questions you have with your health care provider. Document Revised: 06/05/2023 Document Reviewed: 01/03/2023 Elsevier Patient Education  2024 [employer].

## 2023-12-09 NOTE — Assessment & Plan Note (Signed)
 Red flag symptoms reviewed. Follow up ultrasound for completion of anatomy scheduled 4/14.

## 2023-12-11 ENCOUNTER — Ambulatory Visit: Payer: BC Managed Care – PPO | Admitting: Dermatology

## 2023-12-29 ENCOUNTER — Ambulatory Visit (INDEPENDENT_AMBULATORY_CARE_PROVIDER_SITE_OTHER)

## 2023-12-29 ENCOUNTER — Ambulatory Visit: Admitting: Dermatology

## 2023-12-29 ENCOUNTER — Ambulatory Visit (INDEPENDENT_AMBULATORY_CARE_PROVIDER_SITE_OTHER): Admitting: Obstetrics

## 2023-12-29 ENCOUNTER — Encounter: Payer: Self-pay | Admitting: Dermatology

## 2023-12-29 ENCOUNTER — Other Ambulatory Visit: Payer: Self-pay

## 2023-12-29 VITALS — BP 110/71 | HR 76 | Wt 192.0 lb

## 2023-12-29 DIAGNOSIS — W908XXA Exposure to other nonionizing radiation, initial encounter: Secondary | ICD-10-CM | POA: Diagnosis not present

## 2023-12-29 DIAGNOSIS — D492 Neoplasm of unspecified behavior of bone, soft tissue, and skin: Secondary | ICD-10-CM | POA: Diagnosis not present

## 2023-12-29 DIAGNOSIS — Z362 Encounter for other antenatal screening follow-up: Secondary | ICD-10-CM

## 2023-12-29 DIAGNOSIS — Z86018 Personal history of other benign neoplasm: Secondary | ICD-10-CM

## 2023-12-29 DIAGNOSIS — D229 Melanocytic nevi, unspecified: Secondary | ICD-10-CM

## 2023-12-29 DIAGNOSIS — D2372 Other benign neoplasm of skin of left lower limb, including hip: Secondary | ICD-10-CM

## 2023-12-29 DIAGNOSIS — Z3400 Encounter for supervision of normal first pregnancy, unspecified trimester: Secondary | ICD-10-CM

## 2023-12-29 DIAGNOSIS — Z1283 Encounter for screening for malignant neoplasm of skin: Secondary | ICD-10-CM

## 2023-12-29 DIAGNOSIS — L578 Other skin changes due to chronic exposure to nonionizing radiation: Secondary | ICD-10-CM | POA: Diagnosis not present

## 2023-12-29 DIAGNOSIS — Z131 Encounter for screening for diabetes mellitus: Secondary | ICD-10-CM

## 2023-12-29 DIAGNOSIS — Z3A23 23 weeks gestation of pregnancy: Secondary | ICD-10-CM | POA: Diagnosis not present

## 2023-12-29 DIAGNOSIS — D225 Melanocytic nevi of trunk: Secondary | ICD-10-CM | POA: Diagnosis not present

## 2023-12-29 DIAGNOSIS — D649 Anemia, unspecified: Secondary | ICD-10-CM

## 2023-12-29 DIAGNOSIS — Z2913 Encounter for prophylactic Rho(D) immune globulin: Secondary | ICD-10-CM | POA: Diagnosis not present

## 2023-12-29 DIAGNOSIS — Z331 Pregnant state, incidental: Secondary | ICD-10-CM

## 2023-12-29 DIAGNOSIS — D2272 Melanocytic nevi of left lower limb, including hip: Secondary | ICD-10-CM

## 2023-12-29 DIAGNOSIS — L821 Other seborrheic keratosis: Secondary | ICD-10-CM

## 2023-12-29 DIAGNOSIS — D2271 Melanocytic nevi of right lower limb, including hip: Secondary | ICD-10-CM

## 2023-12-29 DIAGNOSIS — L814 Other melanin hyperpigmentation: Secondary | ICD-10-CM

## 2023-12-29 DIAGNOSIS — Z113 Encounter for screening for infections with a predominantly sexual mode of transmission: Secondary | ICD-10-CM

## 2023-12-29 DIAGNOSIS — D489 Neoplasm of uncertain behavior, unspecified: Secondary | ICD-10-CM

## 2023-12-29 NOTE — Patient Instructions (Addendum)

## 2023-12-29 NOTE — Assessment & Plan Note (Signed)
-  Anatomy scan completed today -Plans to transfer to [place] for water birth -Discussed comfort measures for rib pain -Prepared for 28-week labs at next visit. -Reviewed kick counts and preterm labor warning signs. Instructed to call office or come to hospital with persistent headache, vision changes, regular contractions, leaking of fluid, decreased fetal movement or vaginal bleeding.

## 2023-12-29 NOTE — Progress Notes (Signed)
 Follow-Up Visit   Subjective  Bethany Aguilar is a 33 y.o. female who presents for the following: Skin Cancer Screening and Full Body Skin Exam hx of multiple dysplastic nevi, today reports moles on both right and left leg she would like checked Patient is 6 months pregnant The patient presents for Total-Body Skin Exam (TBSE) for skin cancer screening and mole check. The patient has spots, moles and lesions to be evaluated, some may be new or changing and the patient may have concern these could be cancer.  Photos of bx today back  Right abdomen and side Photos on back Left and rigth thigh photos Photos of legs and dermatofibroma   The following portions of the chart were reviewed this encounter and updated as appropriate: medications, allergies, medical history  Review of Systems:  No other skin or systemic complaints except as noted in HPI or Assessment and Plan.  Objective  Well appearing patient in no apparent distress; mood and affect are within normal limits.  A full examination was performed including scalp, head, eyes, ears, nose, lips, neck, chest, axillae, abdomen, back, buttocks, bilateral upper extremities, bilateral lower extremities, hands, feet, fingers, toes, fingernails, and toenails. All findings within normal limits unless otherwise noted below.   Relevant physical exam findings are noted in the Assessment and Plan.                                                             left mid low back approximatly 6 cm lateral to spine 0.6 cm irregular brown macule   right mid calf 0.6 cm irregular brown macule    Assessment & Plan   SKIN CANCER SCREENING PERFORMED TODAY.  ACTINIC DAMAGE - Chronic condition, secondary to cumulative UV/sun exposure - diffuse scaly erythematous macules with underlying dyspigmentation - Recommend daily broad spectrum sunscreen SPF 30+ to sun-exposed areas, reapply every 2  hours as needed.  - Staying in the shade or wearing long sleeves, sun glasses (UVA+UVB protection) and wide brim hats (4-inch brim around the entire circumference of the hat) are also recommended for sun protection.  - Call for new or changing lesions.  LENTIGINES, SEBORRHEIC KERATOSES, HEMANGIOMAS - Benign normal skin lesions - Benign-appearing - Call for any changes  MELANOCYTIC NEVI - multiple brown nevi at right abdomen and side, back, right and left thighs, abdomen, lower legs see photos  - Tan-brown and/or pink-flesh-colored symmetric macules and papules - Benign appearing on exam today - Observation - Call clinic for new or changing moles - Recommend daily use of broad spectrum spf 30+ sunscreen to sun-exposed areas.   DERMATOFIBROMA X2 left lower leg Exam: Firm pink/brown papulenodule with dimple sign. Left medial pretibial and left lateral calf  see photos  Treatment Plan: Reassured  A dermatofibroma is a benign growth possibly related to trauma, such as an insect bite, cut from shaving, or inflamed acne-type bump.  Treatment options to remove include shave or excision with resulting scar and risk of recurrence.  Since benign-appearing and not bothersome, will observe for now.   HISTORY OF DYSPLASTIC NEVUS Multiple areas see history  05/02/2016 right back spinal below bra 11/06/2016 left low back lateral flank 05/14/2018 right low back 4 cm lateral to spine superior, right low back 4 cm lateral inferior, right upper back  3 cm lateral to spine 09/14/2018 - left medial breast, right lateral epigastric 06/07/2020 - right anterior axillary fold moderate atypia 06/07/2020 - left scapula moderate to severe atypia shave removal  06/07/2020 right lower back, moderate atypia 08/01/2021 right upper back paraspinal moderate 08/01/2021 left upper back medial to mid scapula 4 cm latera to spine moderate to severe  12/09/2022 right upper back paraspinal mild 06/11/2023 left mid back 3 cm  lateral to spine severe excised 06/24/2023 06/11/2023 left upper back 3 cm lateral to spine 06/11/2023 right mid back lateral infra scapular moderate  No evidence of recurrence today Recommend regular full body skin exams Recommend daily broad spectrum sunscreen SPF 30+ to sun-exposed areas, reapply every 2 hours as needed.  Call if any new or changing lesions are noted between office visits   NEOPLASM OF UNCERTAIN BEHAVIOR (2) left mid low back approximatly 6 cm lateral to spine Epidermal / dermal shaving  Lesion diameter (cm):  0.6 Informed consent: discussed and consent obtained   Timeout: patient name, date of birth, surgical site, and procedure verified   Procedure prep:  Patient was prepped and draped in usual sterile fashion Prep type:  Isopropyl alcohol Anesthesia: the lesion was anesthetized in a standard fashion   Anesthetic:  1% lidocaine w/ epinephrine 1-100,000 buffered w/ 8.4% NaHCO3 Instrument used: flexible razor blade   Hemostasis achieved with: pressure, aluminum chloride and electrodesiccation   Outcome: patient tolerated procedure well   Post-procedure details: sterile dressing applied and wound care instructions given   Dressing type: bandage and petrolatum   Specimen 1 - Surgical pathology Differential Diagnosis: Irregular nevus r/o dysplasia   Check Margins: No right mid calf Epidermal / dermal shaving  Lesion diameter (cm):  0.6 Informed consent: discussed and consent obtained   Timeout: patient name, date of birth, surgical site, and procedure verified   Procedure prep:  Patient was prepped and draped in usual sterile fashion Prep type:  Isopropyl alcohol Anesthesia: the lesion was anesthetized in a standard fashion   Anesthetic:  1% lidocaine w/ epinephrine 1-100,000 buffered w/ 8.4% NaHCO3 Instrument used: flexible razor blade   Hemostasis achieved with: pressure, aluminum chloride and electrodesiccation   Outcome: patient tolerated procedure well    Post-procedure details: sterile dressing applied and wound care instructions given   Dressing type: bandage and petrolatum   Specimen 2 - Surgical pathology Differential Diagnosis: Irregular nevus r/o dysplasia   Check Margins: No Irregular nevus r/o dysplasia  Return in about 9 months (around 09/29/2024) for tbse hx of dyplastic .  IRandee Busing, CMA, am acting as scribe for Celine Collard, MD.   Documentation: I have reviewed the above documentation for accuracy and completeness, and I agree with the above.  Celine Collard, MD

## 2023-12-29 NOTE — Progress Notes (Signed)
 Per patient request needs referral to transfer to [facility]

## 2023-12-29 NOTE — Progress Notes (Signed)
    Return Prenatal Note   Assessment/Plan   Plan  33 y.o. G1P0000 at [redacted]w[redacted]d presents for follow-up OB visit. Reviewed prenatal record including previous visit note.  Supervision of normal pregnancy -Anatomy scan completed today -Plans to transfer to [place] for water birth -Discussed comfort measures for rib pain -Prepared for 28-week labs at next visit. -Reviewed kick counts and preterm labor warning signs. Instructed to call office or come to hospital with persistent headache, vision changes, regular contractions, leaking of fluid, decreased fetal movement or vaginal bleeding.     Orders Placed This Encounter  Procedures   28 Weeks RH-Panel    Standing Status:   Future    Expected Date:   01/28/2024    Expiration Date:   12/28/2024   Return in about 4 weeks (around 01/26/2024).   Future Appointments  Date Time Provider Department Center  12/29/2023 11:15 AM Phylliss Brenner, CNM [facility] None  12/29/2023  4:00 PM Elta Halter, MD [facility] None  01/06/2024  8:15 AM Angelita Kendall, CNM [facility] None  01/26/2024  9:00 AM [facility] None  01/26/2024  9:35 AM Slaughterbeck, Sherline Distel, CNM [facility] None    For next visit:  ROB with 28-week labs, TDaP, and Rhogam    Subjective   Bethany Aguilar is having some pain in her right lower rib cage, mostly at night. She denies HA and visual changes. She has her water birth class scheduled. She has some general pregnancy questions.   Movement: Present Contractions: Not present  Objective   Flow sheet Vitals: Pulse Rate: 76 BP: 110/71 Fundal Height: 25 cm Fetal Heart Rate (bpm): 154 Total weight gain: 29 lb (13.2 kg)  General Appearance  No acute distress, well appearing, and well nourished Pulmonary   Normal work of breathing Neurologic   Alert and oriented to person, place, and time Psychiatric   Mood and affect within normal limits  Josue Nip, CNM 12/29/23 11:08 AM

## 2024-01-06 ENCOUNTER — Encounter: Payer: Self-pay | Admitting: Advanced Practice Midwife

## 2024-01-06 ENCOUNTER — Ambulatory Visit (INDEPENDENT_AMBULATORY_CARE_PROVIDER_SITE_OTHER): Admitting: Advanced Practice Midwife

## 2024-01-06 VITALS — BP 116/58 | HR 74 | Wt 194.8 lb

## 2024-01-06 DIAGNOSIS — Z3400 Encounter for supervision of normal first pregnancy, unspecified trimester: Secondary | ICD-10-CM

## 2024-01-06 DIAGNOSIS — Z130 Encounter for screening for diseases of the blood and blood-forming organs and certain disorders involving the immune mechanism: Secondary | ICD-10-CM

## 2024-01-06 DIAGNOSIS — Z3A28 28 weeks gestation of pregnancy: Secondary | ICD-10-CM

## 2024-01-06 DIAGNOSIS — Z3A24 24 weeks gestation of pregnancy: Secondary | ICD-10-CM | POA: Diagnosis not present

## 2024-01-06 DIAGNOSIS — Z131 Encounter for screening for diabetes mellitus: Secondary | ICD-10-CM

## 2024-01-06 DIAGNOSIS — Z113 Encounter for screening for infections with a predominantly sexual mode of transmission: Secondary | ICD-10-CM

## 2024-01-06 DIAGNOSIS — Z3402 Encounter for supervision of normal first pregnancy, second trimester: Secondary | ICD-10-CM

## 2024-01-06 LAB — SURGICAL PATHOLOGY

## 2024-01-06 NOTE — Progress Notes (Signed)
 Routine Prenatal Care Visit  Subjective  Bethany Aguilar is a 33 y.o. G1P0000 at [redacted]w[redacted]d being seen today for ongoing prenatal care.  She is currently monitored for the following issues for this low-risk pregnancy and has Supervision of normal pregnancy on their problem list.  ----------------------------------------------------------------------------------- Patient reports she is doing well. Plans to keep her 28 wk visit with [facility] and then transfer to [facility] for continued Ob care for water birth as desired.   Contractions: Not present. Vag. Bleeding: None.  Movement: Present. Leaking Fluid denies.  ----------------------------------------------------------------------------------- The following portions of the patient's history were reviewed and updated as appropriate: allergies, current medications, past family history, past medical history, past social history, past surgical history and problem list. Problem list updated.  Objective  Blood pressure (!) 116/58, pulse 74, weight 194 lb 12.8 oz (88.4 kg), last menstrual period 07/18/2023. Pregravid weight 163 lb (73.9 kg) Total Weight Gain 31 lb 12.8 oz (14.4 kg) Urinalysis: Urine Protein    Urine Glucose    Fetal Status: Fetal Heart Rate (bpm): 138 Fundal Height: 24 cm Movement: Present     General:  Alert, oriented and cooperative. Patient is in no acute distress.  Skin: Skin is warm and dry. No rash noted.   Cardiovascular: Normal heart rate noted  Respiratory: Normal respiratory effort, no problems with respiration noted  Abdomen: Soft, gravid, appropriate for gestational age. Pain/Pressure: Absent     Pelvic:  Cervical exam deferred        Extremities: Normal range of motion.  Edema: None  Mental Status: Normal mood and affect. Normal behavior. Normal judgment and thought content.   Assessment   33 y.o. G1P0000 at [redacted]w[redacted]d by  04/23/2024, by Last Menstrual Period presenting for routine prenatal visit  Plan   G1 Problems (from  10/06/23 to present)    Clinical Staff Provider  Office Location  [facility] Dating  04/23/2024, by Last Menstrual Period  Language  English Anatomy US     Flu Vaccine  Declined Genetic Screen  NIPS:   TDaP vaccine    Hgb A1C or  GTT Early : Third trimester :   Covid    LAB RESULTS   Rhogam  O/Negative/-- (01/28 1110)  Blood Type O/Negative/-- (01/28 1110)   RSV  Antibody Negative (01/28 1110)  Feeding Plan Breast only Rubella 2.10 (01/28 1110)  Contraception Condoms RPR Non Reactive (01/28 1110)   Circumcision If applicable HBsAg Negative (01/28 1110)   Pediatrician  [facility] HIV Non Reactive (01/28 1110)  Support Person Torii Royse Varicella Non Reactive (01/28 1110)  Prenatal Classes Yes GBS  (For PCN allergy, check sensitivities)     Hep C Non Reactive (01/28 1110)   BTL Consent  Pap Diagnosis  Date Value Ref Range Status  11/04/2018   Final   NEGATIVE FOR INTRAEPITHELIAL LESIONS OR MALIGNANCY.    VBAC Consent  Hgb Electro      CF      SMA           Preterm labor symptoms and general obstetric precautions including but not limited to vaginal bleeding, contractions, leaking of fluid and fetal movement were reviewed in detail with the patient. Please refer to After Visit Summary for other counseling recommendations.   Return in about 4 weeks (around 02/03/2024) for 28 wk labs/rhogam/rob.  Angelita Kendall, CNM 01/06/2024 8:58 AM

## 2024-01-07 ENCOUNTER — Telehealth: Payer: Self-pay

## 2024-01-07 ENCOUNTER — Encounter: Payer: Self-pay | Admitting: Dermatology

## 2024-01-07 NOTE — Telephone Encounter (Signed)
-----   Message from Celine Collard sent at 01/07/2024 11:03 AM EDT ----- FINAL DIAGNOSIS        1. 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 Skin, right mid calf :       DYSPLASTIC JUNCTIONAL NEVUS WITH SEVERE ATYPIA, CLOSE TO MARGIN, SEE DESCRIPTION   1- Moderate to Severe dysplastic Schedule for surgery (Surgery will need to be scheduled with Dr Annette Barters or Dr Felipe Horton or Dr Fain Home since I am currently not doing surgeries since my spinal neck surgery) 2- Severe dysplastic Margins clear, but close to margin May need additional procedure Recheck next visit 09/29/2024

## 2024-01-07 NOTE — Telephone Encounter (Signed)
 Left voicemail to return my call

## 2024-01-12 ENCOUNTER — Telehealth: Payer: Self-pay

## 2024-01-12 NOTE — Telephone Encounter (Addendum)
 Tried calling patient regarding results. No answer. LM for patient to return call.   ----- Message from Celine Collard sent at 01/07/2024 11:03 AM EDT ----- FINAL DIAGNOSIS        1. 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 Skin, right mid calf :       DYSPLASTIC JUNCTIONAL NEVUS WITH SEVERE ATYPIA, CLOSE TO MARGIN, SEE DESCRIPTION   1- Moderate to Severe dysplastic Schedule for surgery (Surgery will need to be scheduled with Dr Annette Barters or Dr Felipe Horton or Dr Fain Home since I am currently not doing surgeries since my spinal neck surgery) 2- Severe dysplastic Margins clear, but close to margin May need additional procedure Recheck next visit 09/29/2024

## 2024-01-13 NOTE — Telephone Encounter (Signed)
 Patient returned phone call to office. Left message with patient this morning to return call.  Confirmed with Dr. Annette Barters, patient can schedule surgery with her. aw

## 2024-01-13 NOTE — Telephone Encounter (Signed)
 Patient advised of BX results and surgery scheduled with Dr. Annette Barters. aw

## 2024-01-26 ENCOUNTER — Other Ambulatory Visit

## 2024-01-26 ENCOUNTER — Ambulatory Visit (INDEPENDENT_AMBULATORY_CARE_PROVIDER_SITE_OTHER): Admitting: Certified Nurse Midwife

## 2024-01-26 ENCOUNTER — Encounter: Payer: Self-pay | Admitting: Certified Nurse Midwife

## 2024-01-26 VITALS — BP 112/72 | HR 92 | Wt 199.1 lb

## 2024-01-26 DIAGNOSIS — Z23 Encounter for immunization: Secondary | ICD-10-CM

## 2024-01-26 DIAGNOSIS — Z2913 Encounter for prophylactic Rho(D) immune globulin: Secondary | ICD-10-CM

## 2024-01-26 DIAGNOSIS — Z113 Encounter for screening for infections with a predominantly sexual mode of transmission: Secondary | ICD-10-CM

## 2024-01-26 DIAGNOSIS — Z3A27 27 weeks gestation of pregnancy: Secondary | ICD-10-CM | POA: Diagnosis not present

## 2024-01-26 DIAGNOSIS — Z3402 Encounter for supervision of normal first pregnancy, second trimester: Secondary | ICD-10-CM | POA: Diagnosis not present

## 2024-01-26 DIAGNOSIS — Z131 Encounter for screening for diabetes mellitus: Secondary | ICD-10-CM

## 2024-01-26 DIAGNOSIS — Z3400 Encounter for supervision of normal first pregnancy, unspecified trimester: Secondary | ICD-10-CM

## 2024-01-26 DIAGNOSIS — D649 Anemia, unspecified: Secondary | ICD-10-CM

## 2024-01-26 MED ORDER — RHO D IMMUNE GLOBULIN 1500 UNIT/2ML IJ SOSY
300.0000 ug | PREFILLED_SYRINGE | Freq: Once | INTRAMUSCULAR | Status: AC
  Administered 2024-01-26: 300 ug via INTRAMUSCULAR

## 2024-01-26 NOTE — Progress Notes (Signed)
    Return Prenatal Note   Assessment/Plan   Plan  33 y.o. G1P0000 at [redacted]w[redacted]d presents for follow-up OB visit. Reviewed prenatal record including previous visit note.  Supervision of normal pregnancy Has NOB scheduled for [facility] to transfer care on 5/22. 28 week labs drawn today. RhIG given RPR, CBC, HIV and 1-hour GTT sent Reviewed the importance of monitoring fetal movement as well as warning signs for pre-eclampsia and preterm labor.    Orders Placed This Encounter  Procedures   Tdap vaccine greater than or equal to 7yo IM   No follow-ups on file.   Future Appointments  Date Time Provider Department Center  02/05/2024 10:15 AM Salomon Cree, CNM [facility]  03/01/2024  1:30 PM Artemio Larry, MD [facility] None  09/29/2024  4:00 PM Elta Halter, MD [facility] None    For next visit:  transferring care     Subjective   33 y.o. G1P0000 at [redacted]w[redacted]d presents for this follow-up prenatal visit.  Patient no concerns today Patient reports: Movement: Present  Objective   Flow sheet Vitals: Pulse Rate: 92 BP: 112/72 Fundal Height: 28 cm Fetal Heart Rate (bpm): 150 Total weight gain: 36 lb 1.6 oz (16.4 kg)  General Appearance  No acute distress, well appearing, and well nourished Pulmonary   Normal work of breathing Neurologic   Alert and oriented to person, place, and time Psychiatric   Mood and affect within normal limits  Donato Fu, CNM  05/12/259:47 AM

## 2024-01-26 NOTE — Progress Notes (Signed)
 ROB. She states daily fetal movement and that she is feeling great. Completed GCT, received Rhogam and TDAP injection and signed BTC today. Patient states no questions or concerns at this time.

## 2024-01-26 NOTE — Assessment & Plan Note (Signed)
 Has NOB scheduled for [facility] to transfer care on 5/22. 28 week labs drawn today. RhIG given RPR, CBC, HIV and 1-hour GTT sent Reviewed the importance of monitoring fetal movement as well as warning signs for pre-eclampsia and preterm labor.

## 2024-01-27 ENCOUNTER — Ambulatory Visit: Payer: Self-pay | Admitting: Obstetrics

## 2024-01-27 ENCOUNTER — Telehealth: Payer: Self-pay

## 2024-01-27 LAB — 28 WEEKS RH-PANEL
Antibody Screen: NEGATIVE
Basophils Absolute: 0 10*3/uL (ref 0.0–0.2)
Basos: 0 %
EOS (ABSOLUTE): 0.1 10*3/uL (ref 0.0–0.4)
Eos: 1 %
Gestational Diabetes Screen: 85 mg/dL (ref 70–139)
HIV Screen 4th Generation wRfx: NONREACTIVE
Hematocrit: 35.8 % (ref 34.0–46.6)
Hemoglobin: 12 g/dL (ref 11.1–15.9)
Immature Grans (Abs): 0.1 10*3/uL (ref 0.0–0.1)
Immature Granulocytes: 1 %
Lymphocytes Absolute: 1.2 10*3/uL (ref 0.7–3.1)
Lymphs: 12 %
MCH: 30.9 pg (ref 26.6–33.0)
MCHC: 33.5 g/dL (ref 31.5–35.7)
MCV: 92 fL (ref 79–97)
Monocytes Absolute: 0.6 10*3/uL (ref 0.1–0.9)
Monocytes: 6 %
Neutrophils Absolute: 8.7 10*3/uL — ABNORMAL HIGH (ref 1.4–7.0)
Neutrophils: 80 %
Platelets: 237 10*3/uL (ref 150–450)
RBC: 3.88 x10E6/uL (ref 3.77–5.28)
RDW: 12.5 % (ref 11.7–15.4)
RPR Ser Ql: NONREACTIVE
WBC: 10.7 10*3/uL (ref 3.4–10.8)

## 2024-01-27 NOTE — Telephone Encounter (Signed)
 TRIAGE VOICEMAIL: Patient inquiring about abnormal Neutrophils Absolute  on 28 week labs. Inquiring if this is going to require any follow up visit.

## 2024-02-02 NOTE — Progress Notes (Signed)
   PRENATAL VISIT NOTE  Subjective:  Bethany Aguilar is a 33 y.o. G1P0000 at [redacted]w[redacted]d being seen today for ongoing prenatal care.  She is currently monitored for the following issues for this low-risk pregnancy and has Supervision of normal pregnancy and Hydronephrosis of right kidney on their problem list.  Patient reports seen recently and diagnosed with hydronephrosis, no pain currently.  Contractions: Not present. Vag. Bleeding: None.  Movement: Present. Denies leaking of fluid.   The following portions of the patient's history were reviewed and updated as appropriate: allergies, current medications, past family history, past medical history, past social history, past surgical history and problem list.   Objective:    Vitals:   02/05/24 1018  BP: 112/72  Pulse: 82  Weight: 199 lb 6.4 oz (90.4 kg)    Fetal Status:  Fetal Heart Rate (bpm): 140-141   Movement: Present    General: Alert, oriented and cooperative. Patient is in no acute distress.  Skin: Skin is warm and dry. No rash noted.   Cardiovascular: Normal heart rate noted  Respiratory: Normal respiratory effort, no problems with respiration noted  Abdomen: Soft, gravid, appropriate for gestational age.  Pain/Pressure: Absent     Pelvic: Cervical exam deferred        Extremities: Normal range of motion.  Edema: None  Mental Status: Normal mood and affect. Normal behavior. Normal judgment and thought content.   Assessment and Plan:  Pregnancy: G1P0000 at [redacted]w[redacted]d 1. Encounter for supervision of normal first pregnancy in second trimester (Primary) - Doing well, feeling regular and vigorous fetal movement - Hydronephrosis diagnosed with pain at diagnosis but none currently. No stone seen on imaging.  2. [redacted] weeks gestation of pregnancy - 3T labs previously drawn, WNL - Antcipatory guidance regarding upcoming care, waterbirth and childbirth education.   - Pt is interested in waterbirth.  No contraindications at this time per  chart review/patient assessment.   - Pt to enroll in class, see CNMs for most visits in the office.  - Discussed waterbirth as option for low-risk pregnancy.  Reviewed conditions that may arise during pregnancy that will risk pt out of waterbirth including hypertension, diabetes, fetal growth restriction <10%ile, etc.   Preterm labor symptoms and general obstetric precautions including but not limited to vaginal bleeding, contractions, leaking of fluid and fetal movement were reviewed in detail with the patient. Please refer to After Visit Summary for other counseling recommendations.   Return in about 2 weeks (around 02/19/2024) for LOB.  Future Appointments  Date Time Provider Department Center  02/17/2024  8:35 AM Constant, Carles Cheadle, MD [facility]  03/01/2024  1:30 PM Artemio Larry, MD [facility] None  03/02/2024  9:35 AM Izell Marsh, MD [facility]  03/22/2024  3:30 PM Thurmon Florida, Kathrine Paris, MD [facility]  04/06/2024  3:30 PM Dodie Frees, Carles Cheadle, MD [facility]  09/29/2024  4:00 PM Elta Halter, MD [facility] None    Salomon Cree, CNM

## 2024-02-02 NOTE — Patient Instructions (Addendum)
 Considering Waterbirth? Guide for patients at [facility]) Why consider waterbirth? Gentle birth for babies  Less pain medicine used in labor  May allow for passive descent/less pushing  May reduce perineal tears  More mobility and instinctive maternal position changes  Increased maternal relaxation   Is waterbirth safe? What are the risks of infection, drowning or other complications? Infection:  Very low risk (3.7 % for tub vs 4.8% for bed)  7 in 8000 waterbirths with documented infection  Poorly cleaned equipment most common cause  Slightly lower group B strep transmission rate  Drowning  Maternal:  Very low risk  Related to seizures or fainting  Newborn:  Very low risk. No evidence of increased risk of respiratory problems in multiple large studies  Physiological protection from breathing under water  Avoid underwater birth if there are any fetal complications  Once baby's head is out of the water, keep it out.  Birth complication  Some reports of cord trauma, but risk decreased by bringing baby to surface gradually  No evidence of increased risk of shoulder dystocia. Mothers can usually change positions faster in water than in a bed, possibly aiding the maneuvers to free the shoulder.   There are 2 things you MUST do to have a waterbirth with [facility]: Attend a waterbirth class at [facility] at [facility]   3rd Wednesday of every month from 7-9 pm (virtual during COVID) [employer] at [email or web address] or [email or web address] or by calling [phone] Bring us  the certificate from the class to your prenatal appointment or send via MyChart Meet with a midwife at 36 weeks* to see if you can still plan a waterbirth and to sign the consent.   *We also recommend that you schedule as many of your prenatal visits with a midwife as possible.    Helpful information: You may want to bring a bathing suit top to the hospital  to wear during labor but this is optional.  All other supplies are provided by the hospital. Please arrive at the hospital with signs of active labor, and do not wait at home until late in labor. It takes 45 min- 1 hour for fetal monitoring, and check in to your room to take place, plus transport and filling of the waterbirth tub.    Things that would prevent you from having a waterbirth: Premature, <37wks  Previous cesarean birth  Presence of thick meconium-stained fluid  Multiple gestation (Twins, triplets, etc.)  Uncontrolled diabetes or gestational diabetes requiring medication  Hypertension diagnosed in pregnancy or preexisting hypertension (gestational hypertension, preeclampsia, or chronic hypertension) Fetal growth restriction (your baby measures less than 10th percentile on ultrasound) Heavy vaginal bleeding  Non-reassuring fetal heart rate  Active infection (MRSA, etc.). Group B Strep is NOT a contraindication for waterbirth.  If your labor has to be induced and induction method requires continuous monitoring of the baby's heart rate  Other risks/issues identified by your obstetrical provider   Please remember that birth is unpredictable. Under certain unforeseeable circumstances your provider may advise against giving birth in the tub. These decisions will be made on a case-by-case basis and with the safety of you and your baby as our highest priority.    Updated 12/19/21   We highly recommend childbirth education to help you plan for labor and begin practicing coping skills (which will be needed with or without pain meds).  [place] Childbirth Education Options: Sign up by visiting [email or web address]  Childbirth ~ Self-Paced eClass (  English and [place]) This online class offers you the freedom to complete a childbirth education series in the comfort of your own home at your own pace.  Childbirth Class (In-Person 4-Week Series  or on Saturdays, Virtual 4-Week Series ~  [place]) This interactive in-person class series will help you and your partner prepare for your birth experience. Topics include: Labor & Birth, Comfort Measures, Breathing Techniques, Massage, Medical Interventions, Pain Management Options, Cesarean Birth, Postpartum Care, and Newborn Care  Comfort Techniques for Labor ~ In-Person Class [facility]) This interactive class is designed for parents-to-be who want to learn & practice hands-on skills to help relieve some of the discomfort of labor and encourage their babies to rotate toward the best position for birth. Moms and their partners will be able to try a variety of labor positions with birth balls and rebozos as well as practice breathing, relaxation, and visualization techniques.  Natural Childbirth Class (In-Person 5-Week Series, In-Person on Saturdays or Virtual 5-Week Series ~ [place]) This class series is designed for expectant parents who want to learn and practice natural methods of coping with the process of labor and childbirth.  Cesarean Birth Self-Paced eClass (English and Spanish) This online course provides comprehensive information you can trust as you prepare for a possible cesarean birth. In this class, you'll learn how to make your birth and recovery comfortable and joyful through instructive video clips, animations, and activities.  Waterbirth ~ Airline pilot Interested in a waterbirth? In addition to a consultation with your credentialed waterbirth provider, this free, informational online class will help you discover whether waterbirth is the right fit for you. Not all obstetrical practices offer waterbirth, so check with your healthcare provider.  Tour Probation officer) - [facility] [employer] our 4 minute video tour of [facility] located in [place].   [place] Parenting Education Options:  Pregnancy 101 (Virtual) Congratulations on your pregnancy!  This class is geared toward moms in their first trimester, but everyone is welcome. We are excited to guide you through all aspects of supporting a healthy pregnancy. You will learn what to expect at routine prenatal care appointments, common postpartum adjustments, basic infant safety, and breastfeeding.  Successful Partnering & Parenting ~ In-Person Workshop [facility]) This workshop inspires and equips partners of all economic levels, ages, and cultures to confidently care for their infants, support the birthing persons, and navigate their own transformations into new partners and parents. Learning activities are geared towards supporting partner, but moms are welcome to attend.  'Baby & Me' Parenting Group (Virtual on Wednesdays at 11am) Enjoy this time discussing newborn & infant parenting topics and family adjustment issues with other new parents in a relaxed environment. Each week brings a new speaker or baby-centered activity. This group offers support and connection to parents as they journey through the adjustments and struggles of that sometimes overwhelming first year after the birth of a child.  Baby Safety, CPR, & Choking Class ~ Virtual This life-saving information is meant to encourage parents as they learn important safety and prevention tips as well as infant CPR and relief of choking.  Breastfeeding Class (In-Person in [place] or [employer]) Families learn what to expect in the first days and weeks of breastfeeding your newborn.   Breastfeeding Self-Paced eClass (English & Spanish) Families learn what to expect in the first days and weeks of breastfeeding your newborn.  Caring for Baby ~ In-Person, Virtual or Self-Paced Class This in-person class is for both expectant  and adoptive parents who want to learn and practice the most up-to-date newborn care for their babies. Focus is on birth through the first six weeks of life.  CPR & Choking Relief for Infants & Children ~  In-Person Class [facility]) This in-person course is designed for any parent, expectant parent, or adult who cares for infants or children. Participants learn and demonstrate cardiopulmonary resuscitation and choking relief procedures for both infants and children.  Grandparent Love ~ In-Person Class Grandparents will learn the most updated infant care and safety recommendations. They will discover ways to support their own children during the transition into the parenting role and receive tips on communicating with the new parents.  [place] Parenting Support Group Options:  Bereavement Grief Support Group (Pregnancy/Infant Loss) - Virtual This is an ongoing experience that meets once a month and is designed to help you honor the past, assist you in discovering tools to strengthen you today, and aid you in developing hope for the future.  Breastfeeding & Pumping Support Group (In-Person on Thursdays at 12pm or Virtual on Tuesdays at 5pm) Join us  in-person each Thursday starting June 1st, 2023 at 12pm! This support group is free for all families looking for breastfeeding and/or pumping support.   Community-Based Childbirth Education Options:  [facility] Department Classes:  Childbirth education classes can help you get ready for a positive parenting experience. You can also meet other expectant parents and get free stuff for your baby. Each class runs for five weeks on the same night and costs $45 for the mother-to-be and her support person. Medicaid covers the cost if you are eligible. Call [phone] to register.  [facility] offers a variety of programs for the [employer] and is another great way to get connected. Please go to [email or web address] for more information.  Childbirth With A Twist! Be informed of your options, get educated on birth, understand what your body is doing, learn how to cope, and have a lot of fun and laughs all  while doing it either from the comfort of your couch OR in our cozy office and classroom space near the [place] airport. If you are taking a virtual class, then class is taught LIVE, so you can ask questions and receive answers in real-time from an experienced doula and childbirth educator.  This virtual childbirth education class will meet for five instruction times online.  Although we are based in [place], [place], this virtual class is open to anyone in the world. Please visit: [email or web address] for more information.  Books We Love: The Doula Guide to Childbirth by Arvin Laundry and Alisa App The First-Time Parent's Childbirth Handbook by Dr. Andy Keen, CNM The Birth Partner by Coleen Dauer    Things to Try After 37 weeks to Encourage Labor/Get Ready for Labor:    Try the [facility] Circuit at [email or web address] daily to improve baby's position and encourage the onset of labor.  Walk a little and rest a little every day.  Change positions often.  Cervical Ripening: May try one or both Red Raspberry Leaf capsules or tea:  two 300mg  or 400mg  tablets with each meal, 2-3 times a day, or 1-3 cups of tea daily  Potential Side Effects Of Raspberry Leaf:  Most women do not experience any side effects from drinking raspberry leaf tea. However, nausea and loose stools are possible.  Evening Primrose Oil capsules: take 1 capsule by mouth and place one capsule in the vagina every night.    Some of  the potential side effects:  Upset stomach  Loose stools or diarrhea  Headaches  Nausea  Sex can also help the cervix ripen and encourage labor onset.  5. Eating 6-8 dates per day can help soften and even dilate your cervix. You may eat them plain, in smoothies, or in energy balls.  They do contain sugar so eat with caution and balance with protein.

## 2024-02-03 ENCOUNTER — Encounter: Payer: Self-pay | Admitting: Obstetrics and Gynecology

## 2024-02-03 ENCOUNTER — Encounter: Admitting: Certified Nurse Midwife

## 2024-02-03 ENCOUNTER — Other Ambulatory Visit

## 2024-02-03 ENCOUNTER — Other Ambulatory Visit: Payer: Self-pay

## 2024-02-03 ENCOUNTER — Inpatient Hospital Stay

## 2024-02-03 ENCOUNTER — Inpatient Hospital Stay
Admission: EM | Admit: 2024-02-03 | Discharge: 2024-02-03 | Disposition: A | Discharge status: Home / Self Care | Attending: Obstetrics and Gynecology | Admitting: Obstetrics and Gynecology

## 2024-02-03 DIAGNOSIS — N1330 Unspecified hydronephrosis: Secondary | ICD-10-CM | POA: Insufficient documentation

## 2024-02-03 DIAGNOSIS — Z3A28 28 weeks gestation of pregnancy: Secondary | ICD-10-CM | POA: Insufficient documentation

## 2024-02-03 DIAGNOSIS — O99891 Other specified diseases and conditions complicating pregnancy: Secondary | ICD-10-CM | POA: Diagnosis not present

## 2024-02-03 DIAGNOSIS — R109 Unspecified abdominal pain: Secondary | ICD-10-CM | POA: Diagnosis present

## 2024-02-03 DIAGNOSIS — R102 Pelvic and perineal pain: Secondary | ICD-10-CM | POA: Diagnosis not present

## 2024-02-03 DIAGNOSIS — Z3400 Encounter for supervision of normal first pregnancy, unspecified trimester: Secondary | ICD-10-CM

## 2024-02-03 LAB — CBC WITH DIFFERENTIAL/PLATELET
Abs Immature Granulocytes: 0.09 10*3/uL — ABNORMAL HIGH (ref 0.00–0.07)
Basophils Absolute: 0 10*3/uL (ref 0.0–0.1)
Basophils Relative: 0 %
Eosinophils Absolute: 0.1 10*3/uL (ref 0.0–0.5)
Eosinophils Relative: 1 %
HCT: 35.7 % — ABNORMAL LOW (ref 36.0–46.0)
Hemoglobin: 12.4 g/dL (ref 12.0–15.0)
Immature Granulocytes: 1 %
Lymphocytes Relative: 12 %
Lymphs Abs: 1.5 10*3/uL (ref 0.7–4.0)
MCH: 31 pg (ref 26.0–34.0)
MCHC: 34.7 g/dL (ref 30.0–36.0)
MCV: 89.3 fL (ref 80.0–100.0)
Monocytes Absolute: 0.6 10*3/uL (ref 0.1–1.0)
Monocytes Relative: 5 %
Neutro Abs: 10.1 10*3/uL — ABNORMAL HIGH (ref 1.7–7.7)
Neutrophils Relative %: 81 %
Platelets: 231 10*3/uL (ref 150–400)
RBC: 4 MIL/uL (ref 3.87–5.11)
RDW: 12.3 % (ref 11.5–15.5)
WBC: 12.4 10*3/uL — ABNORMAL HIGH (ref 4.0–10.5)
nRBC: 0 % (ref 0.0–0.2)

## 2024-02-03 LAB — COMPREHENSIVE METABOLIC PANEL WITH GFR
ALT: 20 U/L (ref 0–44)
AST: 19 U/L (ref 15–41)
Albumin: 2.8 g/dL — ABNORMAL LOW (ref 3.5–5.0)
Alkaline Phosphatase: 68 U/L (ref 38–126)
Anion gap: 8 (ref 5–15)
BUN: 7 mg/dL (ref 6–20)
CO2: 22 mmol/L (ref 22–32)
Calcium: 8.4 mg/dL — ABNORMAL LOW (ref 8.9–10.3)
Chloride: 105 mmol/L (ref 98–111)
Creatinine, Ser: 0.41 mg/dL — ABNORMAL LOW (ref 0.44–1.00)
GFR, Estimated: >60 mL/min (ref >60)
Glucose, Bld: 97 mg/dL (ref 70–99)
Potassium: 3.6 mmol/L (ref 3.5–5.1)
Sodium: 135 mmol/L (ref 135–145)
Total Bilirubin: 0.7 mg/dL (ref 0.0–1.2)
Total Protein: 5.9 g/dL — ABNORMAL LOW (ref 6.5–8.1)

## 2024-02-03 LAB — URINALYSIS, COMPLETE (UACMP) WITH MICROSCOPIC
Bilirubin Urine: NEGATIVE
Glucose, UA: NEGATIVE mg/dL
Hgb urine dipstick: NEGATIVE
Ketones, ur: NEGATIVE mg/dL
Nitrite: NEGATIVE
Protein, ur: NEGATIVE mg/dL
Specific Gravity, Urine: 1.016 (ref 1.005–1.030)
pH: 6 (ref 5.0–8.0)

## 2024-02-03 LAB — LIPASE, BLOOD: Lipase: 27 U/L (ref 11–51)

## 2024-02-03 LAB — AMYLASE: Amylase: 76 U/L (ref 28–100)

## 2024-02-03 MED ORDER — OXYCODONE HCL 5 MG PO TABS: 5.0000 mg | ORAL_TABLET | Freq: Four times a day (QID) | ORAL | 0 refills | Status: DC | PRN

## 2024-02-03 MED ORDER — ACETAMINOPHEN 500 MG PO TABS: 1000.0000 mg | ORAL_TABLET | Freq: Four times a day (QID) | ORAL | Status: DC | PRN

## 2024-02-03 MED ORDER — ACETAMINOPHEN 500 MG PO TABS
1000.0000 mg | ORAL_TABLET | Freq: Four times a day (QID) | ORAL | Status: DC | PRN
  Administered 2024-02-03: 1000 mg via ORAL
  Filled 2024-02-03: qty 2

## 2024-02-03 MED ORDER — ONDANSETRON HCL 4 MG PO TABS: 4.0000 mg | ORAL_TABLET | Freq: Three times a day (TID) | ORAL | 0 refills | Status: DC | PRN

## 2024-02-03 NOTE — Progress Notes (Signed)
 Pt discharged home per order.  Pt reports no current pain.  Pt stable and ambulatory and an After Visit Summary was printed and given to the patient. Discharge education completed with patient/family including follow up instructions, appointments, and medication list. Pt received labor precautions. Patient able to verbalize understanding, all questions fully answered upon discharge.

## 2024-02-03 NOTE — Progress Notes (Signed)
 Pt presents to L/D triage with reported right-sided abdominal pain, described as cramping, that stretches to her back and middle. She rates the pain a 4/10 and states that it is worsened by movement or staying in the same position for an extended period.  Pt reports last BM 5/19- nothing atypical. She also reports no urinary symptoms other than frequency.  Monitors applied and assessing. Initial FHT 150.  UA collected and CNM notified.

## 2024-02-03 NOTE — Discharge Summary (Addendum)
 Physician Final Progress Note  Patient ID: Bethany Aguilar MRN: [id number] DOB/AGE: 03/23/1991 33 y.o.  Admit date: 02/03/2024 Admitting provider: Zenobia Hila, MD Discharge date: 02/03/2024   Admission Diagnoses:  1) intrauterine pregnancy at [redacted]w[redacted]d  2) right sided pain 3) right flank pain   Discharge Diagnoses:  Active Problems:   Hydronephrosis of right kidney  Round ligament pain   History of Present Illness: The patient is a 33 y.o. female G1P0000 at [redacted]w[redacted]d who presents for right sided and right flank pain. Since Saturday she has felt cramping on her lower right side. Now the pain travels down her groin and towards her right back. The pain come and goes. The pain tends to be more present with when she is staying in one position for too long. The pain improves with movement. She did take 2 325mg  Tylenol last night, it did not help much. She denies any fevers, N/V. She last had a BM yesterday, reports having regular BM's. She has had increased urinary frequency over the last few weeks, denies dysuria or urgency. Does have pain along her lower right back. She has both her appendix and gall bladder. She has noted a pain under her right rib at night after eating.   Past Medical History:  Diagnosis Date   Contraceptive management    Contraceptive management 10/03/2017   Dysplastic nevus 05/02/2016   mid back spinal below bra   Dysplastic nevus 11/06/2016   left low back lat flank   Dysplastic nevus 05/14/2018   right low back 4.0 cm lat to spine sup, right low back 4.0 cm lat to spine inf, right upper back 3.0 cm lat to spine   Dysplastic nevus 09/14/2018   left med breast, right lat epigastric   Dysplastic nevus 06/07/2020   R ant axillary fold, moderat atypia   Dysplastic nevus 06/07/2020   L scapula, moderate to severe atypia - shave removal    Dysplastic nevus 06/07/2020   L mid back, 6.0cm lat to spine, severe excised 08/08/20   Dysplastic nevus 06/07/2020   R sup  medial scapula, moderate to severe atypia - shave removal    Dysplastic nevus 06/07/2020   R lower back, moderate atypia   Dysplastic nevus 08/01/2021   Right upper back paraspinal - Moderate   Dysplastic nevus 08/01/2021   Left upper back medial to mid scapula 4.0 cm lat to spine - Moderate to severe - Recheck on follow up   Dysplastic nevus 12/09/2022   R upper back paraspinal - mild   Dysplastic nevus 06/11/2023   L mid back 3.0cm lat to spine - severe, Excised 06/24/2023   Dysplastic nevus 06/11/2023   L upper back 3.0cm lat to spine   Dysplastic nevus 06/11/2023   R mid back lat infra scapular - moderate   Dysplastic nevus 12/29/2023   left mid low back approximately 6 cm lateral to spine - severe, surgery needs to be scheduled   Dysplastic nevus 12/29/2023   right mid calf - severe, but close to margin    Past Surgical History:  Procedure Laterality Date   SKIN BIOPSY     back, stomach and leg; atypical cells    No current facility-administered medications on file prior to encounter.   Current Outpatient Medications on File Prior to Encounter  Medication Sig Dispense Refill   Prenatal Vit-Fe Fumarate-FA (PRENATAL MULTIVITAMIN) TABS tablet Take 1 tablet by mouth daily at 12 noon.      No Known Allergies  Social  History   Socioeconomic History   Marital status: Married    Spouse name: Wynona Hedger   Number of children: 0   Years of education: 16   Highest education level: Bachelor's degree (e.g., BA, AB, BS)  Occupational History   Occupation: MARKETING/SOCIAL MEDIA   Occupation: Scientist, product/process development: [employer]    Industry: Education   Tobacco Use   Smoking status: Never    Passive exposure: Never   Smokeless tobacco: Never  Vaping Use   Vaping status: Never Used  Substance and Sexual Activity   Alcohol use: Not Currently    Comment: 1-2/month   Drug use: No   Sexual activity: Yes    Partners: Male    Birth control/protection: None  Other Topics  Concern   Not on file  Social History Narrative   Not on file   Social Drivers of Health   Financial Resource Strain: Low Risk  (03/03/2023)   Overall Financial Resource Strain (CARDIA)    Difficulty of Paying Living Expenses: Not hard at all  Food Insecurity: No Food Insecurity (10/06/2023)   Hunger Vital Sign    Worried About Running Out of Food in the Last Year: Never true    Ran Out of Food in the Last Year: Never true  Transportation Needs: No Transportation Needs (10/06/2023)   PRAPARE - Administrator, Civil Service (Medical): No    Lack of Transportation (Non-Medical): No  Physical Activity: Sufficiently Active (03/03/2023)   Exercise Vital Sign    Days of Exercise per Week: 6 days    Minutes of Exercise per Session: 30 min  Stress: No Stress Concern Present (10/06/2023)   [employer] of Occupational Health - Occupational Stress Questionnaire    Feeling of Stress : Not at all  Social Connections: Moderately Integrated (03/03/2023)   Social Connection and Isolation Panel [NHANES]    Frequency of Communication with Friends and Family: More than three times a week    Frequency of Social Gatherings with Friends and Family: Once a week    Attends Religious Services: More than 4 times per year    Active Member of [employer] or Organizations: No    Attends Engineer, structural: Not on file    Marital Status: Married  Catering manager Violence: Not At Risk (10/06/2023)   Humiliation, Afraid, Rape, and Kick questionnaire    Fear of Current or Ex-Partner: No    Emotionally Abused: No    Physically Abused: No    Sexually Abused: No    Family History  Problem Relation Age of Onset   Skin cancer Paternal Grandmother    Diabetes Maternal Aunt    Throat cancer Maternal Aunt 60   Diabetes Maternal Grandfather    Skin cancer Maternal Grandfather    Breast cancer Neg Hx    Ovarian cancer Neg Hx      ROS see HPI   Physical Exam: BP 121/62   Pulse 74   Temp  98 F (36.7 C) (Oral)   Resp 18   Ht 5\' 5"  (1.651 m)   Wt 90.4 kg   LMP 07/18/2023 (Exact Date)   BMI 33.16 kg/m   Physical Exam Constitutional:      Appearance: Normal appearance.  Cardiovascular:     Rate and Rhythm: Normal rate.     Pulses: Normal pulses.  Pulmonary:     Effort: Pulmonary effort is normal.  Abdominal:     Palpations: There is no mass.  Tenderness: There is right CVA tenderness. There is no rebound.     Hernia: No hernia is present.     Comments: Gravid  Tenderness with palpation just below right rib     Musculoskeletal:        General: Normal range of motion.     Cervical back: Normal range of motion.     Right lower leg: No edema.     Left lower leg: No edema.  Neurological:     Mental Status: She is alert.  Skin:    General: Skin is warm.  Psychiatric:        Mood and Affect: Mood normal.        Thought Content: Thought content normal.   ROSHAWNDA PECORA 10/08/1990 [redacted]w[redacted]d  Fetus A Non-Stress Test Interpretation for 02/03/24  Indication: triage visit   Fetal Heart rate baseline 140, moderate variability, pos accel (10x10) neg decel   Uterine Activity Mode: Toco Contraction Frequency (min): none Resting Tone Palpated: Relaxed       Consults: None  Significant Findings/ Diagnostic Studies:    US  RENAL (Accession [id number]) (Order [id number]) Imaging Date: 02/03/2024 Department: [facility] LABOR AND DELIVERY Released By/Authorizing: Frederich Jaksch, CNM (auto-released)   Exam Status  Status  Final [99]   PACS Intelerad Image Link   Show images for US  RENAL Study Result  Narrative & Impression  CLINICAL DATA:  Right flank pain x4 days, [redacted] weeks pregnant   EXAM: RENAL / URINARY TRACT ULTRASOUND COMPLETE   COMPARISON:  None Available.   FINDINGS: Right Kidney:   Renal measurements: 10.4 x 5.4 x 6.4 cm = volume: 189 mL. Parenchyma is isoechoic to the adjacent liver. Mild hydronephrosis. No  evident urolithiasis or focal renal lesion.   Left Kidney:   Renal measurements: 11.5 x 5 x 5.7 cm = volume: 170 mL. Echogenicity within normal limits. No mass or hydronephrosis visualized.   Bladder:   Incompletely distended.  Bilateral ureteral jets demonstrated.   Other:   Gravid uterus partially visualized.   IMPRESSION: Mild right hydronephrosis. No evident urolithiasis.     Electronically Signed   By: Nicoletta Barrier M.D.   On: 02/03/2024 14:43          Procedures: [facility] Course: The patient was admitted to Labor and Delivery Triage for observation. She was found to have a RNST. CBC, CMP, amylase, and lipase were normal. UA was normal except few bacteria. A Renal US  showed Right hydronephrosis without any blockage. She was given Tylenol while in triage. She has not reported any pain for some time. Discussed US  results with Jeanne and her partner. Reviewed she could also have round ligament pain as well. Comfort measures reviewed for both hydronephrosis and round ligament pain. A short course of OxyContin was sent to the pt's pharmacy. Camira has an appointment at [facility] on Thursday.    Reviewed all labs, US  findings, and assessment with Dr Adriana Hopping, in agreement with plan.  Discharge Condition: stable  Disposition: Discharge disposition: 01-Home or Self Care       Diet: Regular diet  Discharge Activity: Activity as tolerated   Allergies as of 02/03/2024   No Known Allergies      Medication List     TAKE these medications    acetaminophen 500 MG tablet Commonly known as: TYLENOL Take 2 tablets (1,000 mg total) by mouth every 6 (six) hours as needed for fever or headache.   ondansetron 4 MG tablet Commonly  known as: Zofran Take 1 tablet (4 mg total) by mouth every 8 (eight) hours as needed for nausea or vomiting.   oxyCODONE 5 MG immediate release tablet Commonly known as: Roxicodone Take 1 tablet (5 mg total) by mouth every 6 (six) hours  as needed.   prenatal multivitamin Tabs tablet Take 1 tablet by mouth daily at 12 noon.         Total time spent taking care of this patient: 60 minutes  Signed: Berkley Breech [facility], CNM  02/03/2024, 3:07 PM

## 2024-02-04 LAB — URINE CULTURE

## 2024-02-05 ENCOUNTER — Ambulatory Visit (INDEPENDENT_AMBULATORY_CARE_PROVIDER_SITE_OTHER): Admitting: Certified Nurse Midwife

## 2024-02-05 VITALS — BP 112/72 | HR 82 | Wt 199.4 lb

## 2024-02-05 DIAGNOSIS — Z3402 Encounter for supervision of normal first pregnancy, second trimester: Secondary | ICD-10-CM | POA: Diagnosis not present

## 2024-02-05 DIAGNOSIS — Z3A28 28 weeks gestation of pregnancy: Secondary | ICD-10-CM

## 2024-02-05 NOTE — Progress Notes (Signed)
 ROB: transfer from [facility]     Hydronephrosis in right kidney- new dx

## 2024-02-17 ENCOUNTER — Encounter: Payer: Self-pay | Admitting: Obstetrics and Gynecology

## 2024-02-17 ENCOUNTER — Ambulatory Visit (INDEPENDENT_AMBULATORY_CARE_PROVIDER_SITE_OTHER): Admitting: Obstetrics and Gynecology

## 2024-02-17 VITALS — BP 112/75 | HR 87 | Wt 201.0 lb

## 2024-02-17 DIAGNOSIS — Z3400 Encounter for supervision of normal first pregnancy, unspecified trimester: Secondary | ICD-10-CM

## 2024-02-17 DIAGNOSIS — Z3A30 30 weeks gestation of pregnancy: Secondary | ICD-10-CM

## 2024-02-17 NOTE — Progress Notes (Signed)
   PRENATAL VISIT NOTE  Subjective:  Bethany Aguilar is a 33 y.o. G1P0000 at [redacted]w[redacted]d being seen today for ongoing prenatal care.  She is currently monitored for the following issues for this low-risk pregnancy and has Supervision of normal pregnancy and Hydronephrosis of right kidney on their problem list.  Patient reports no complaints.  Contractions: Not present. Vag. Bleeding: None.  Movement: Present. Denies leaking of fluid.   The following portions of the patient's history were reviewed and updated as appropriate: allergies, current medications, past family history, past medical history, past social history, past surgical history and problem list.   Objective:    Vitals:   02/17/24 0838  BP: 112/75  Pulse: 87  Weight: 201 lb (91.2 kg)    Fetal Status:  Fetal Heart Rate (bpm): 148   Movement: Present    General: Alert, oriented and cooperative. Patient is in no acute distress.  Skin: Skin is warm and dry. No rash noted.   Cardiovascular: Normal heart rate noted  Respiratory: Normal respiratory effort, no problems with respiration noted  Abdomen: Soft, gravid, appropriate for gestational age.  Pain/Pressure: Absent     Pelvic: Cervical exam deferred        Extremities: Normal range of motion.  Edema: None  Mental Status: Normal mood and affect. Normal behavior. Normal judgment and thought content.   Assessment and Plan:  Pregnancy: G1P0000 at [redacted]w[redacted]d 1. Supervision of normal first pregnancy, antepartum (Primary) Patient is doing well without complaints Researching pediatrician Planning to complete waterbirth course this week  Preterm labor symptoms and general obstetric precautions including but not limited to vaginal bleeding, contractions, leaking of fluid and fetal movement were reviewed in detail with the patient. Please refer to After Visit Summary for other counseling recommendations.   Return in about 2 weeks (around 03/02/2024) for in person, ROB, Low  risk.  Future Appointments  Date Time Provider Department Center  03/01/2024  1:30 PM Artemio Larry, MD [facility] None  03/02/2024  9:35 AM Izell Marsh, MD [facility]  03/22/2024  3:30 PM Thurmon Florida, Kathrine Paris, MD [facility]  04/06/2024  3:30 PM Dodie Frees, Carles Cheadle, MD [facility]  09/29/2024  4:00 PM Elta Halter, MD [facility] None    Verlyn Goad, MD

## 2024-02-17 NOTE — Progress Notes (Signed)
 ROB   CC: None   Pt Consents to Female student in exam room.

## 2024-03-01 ENCOUNTER — Encounter: Payer: Self-pay | Admitting: Dermatology

## 2024-03-01 ENCOUNTER — Ambulatory Visit (INDEPENDENT_AMBULATORY_CARE_PROVIDER_SITE_OTHER): Admitting: Dermatology

## 2024-03-01 DIAGNOSIS — Z86018 Personal history of other benign neoplasm: Secondary | ICD-10-CM

## 2024-03-01 DIAGNOSIS — D239 Other benign neoplasm of skin, unspecified: Secondary | ICD-10-CM

## 2024-03-01 DIAGNOSIS — D225 Melanocytic nevi of trunk: Secondary | ICD-10-CM

## 2024-03-01 DIAGNOSIS — D235 Other benign neoplasm of skin of trunk: Secondary | ICD-10-CM

## 2024-03-01 DIAGNOSIS — D229 Melanocytic nevi, unspecified: Secondary | ICD-10-CM

## 2024-03-01 NOTE — Patient Instructions (Addendum)

## 2024-03-01 NOTE — Progress Notes (Signed)
   Follow-Up Visit   Subjective  Bethany Aguilar is a 33 y.o. female who presents for the following: Dysplastic nevus with moderate to severe atypia, biopsy proven, of the left mid low back approx 6 cm lateral to spine.    The following portions of the chart were reviewed this encounter and updated as appropriate: medications, allergies, medical history  Review of Systems:  No other skin or systemic complaints except as noted in HPI or Assessment and Plan.  Objective  Well appearing patient in no apparent distress; mood and affect are within normal limits.  A focused examination was performed of the following areas: Back  Relevant physical exam findings are noted in the Assessment and Plan.  left mid low back approximately 6 cm lateral to spine 0.6 x 0.4 cm pink flesh papule c/w scar  Assessment & Plan   HISTORY OF DYSPLASTIC NEVUS No evidence of recurrence today- R mid calf Recommend regular full body skin exams Recommend daily broad spectrum sunscreen SPF 30+ to sun-exposed areas, reapply every 2 hours as needed.  Call if any new or changing lesions are noted between office visits   MELANOCYTIC NEVI Exam: Tan-brown and/or pink-flesh-colored symmetric macules and papules back  Treatment Plan: Benign appearing on exam today. Recommend observation. Call clinic for new or changing moles. Recommend daily use of broad spectrum spf 30+ sunscreen to sun-exposed areas.    DYSPLASTIC NEVUS left mid low back approximately 6 cm lateral to spine Discussed resulting small scar with shave removal, and possible recurrence of lesion.  Recommend vaseline ointment to area daily and cover until healed.  Recommend photoprotection/sunscreen to area to prevent discoloration of scar.  Once healed, may apply OTC Serica scar gel bid to thickened scars.  Repeat shave removal today, patient is 8 months pregnant.  Epidermal / dermal shaving - left mid low back approximately 6 cm lateral to  spine  Lesion diameter (cm):  0.8 Informed consent: discussed and consent obtained   Patient was prepped and draped in usual sterile fashion: Area prepped with alcohol. Anesthesia: the lesion was anesthetized in a standard fashion   Anesthetic:  1% lidocaine w/ epinephrine 1-100,000 buffered w/ 8.4% NaHCO3 Instrument used: flexible razor blade   Hemostasis achieved with: pressure, aluminum chloride and electrodesiccation   Outcome: patient tolerated procedure well   Post-procedure details: wound care instructions given   Post-procedure details comment:  Ointment and small bandage applied Specimen 1 - Surgical pathology Differential Diagnosis: Dysplastic nevus with moderate to severe atypia Check Margins: Yes [id number]   Return as scheduled with Dr. Marval Slice, CMA, am acting as scribe for Artemio Larry, MD .   Documentation: I have reviewed the above documentation for accuracy and completeness, and I agree with the above.  Artemio Larry, MD

## 2024-03-02 ENCOUNTER — Ambulatory Visit (INDEPENDENT_AMBULATORY_CARE_PROVIDER_SITE_OTHER): Admitting: Obstetrics and Gynecology

## 2024-03-02 ENCOUNTER — Encounter: Payer: Self-pay | Admitting: Obstetrics and Gynecology

## 2024-03-02 VITALS — BP 112/75 | HR 87 | Wt 204.0 lb

## 2024-03-02 DIAGNOSIS — Z3400 Encounter for supervision of normal first pregnancy, unspecified trimester: Secondary | ICD-10-CM

## 2024-03-02 DIAGNOSIS — Z3A32 32 weeks gestation of pregnancy: Secondary | ICD-10-CM

## 2024-03-02 NOTE — Progress Notes (Signed)
ROB [redacted]w[redacted]d  CC:None      

## 2024-03-02 NOTE — Progress Notes (Signed)
   PRENATAL VISIT NOTE  Subjective:  Bethany Aguilar is a 33 y.o. G1P0000 at [redacted]w[redacted]d being seen today for ongoing prenatal care.  She is currently monitored for the following issues for this low-risk pregnancy and has Supervision of normal pregnancy and Hydronephrosis of right kidney on their problem list.  Patient reports no complaints.  Contractions: Not present. Vag. Bleeding: None.  Movement: Present. Denies leaking of fluid.   The following portions of the patient's history were reviewed and updated as appropriate: allergies, current medications, past family history, past medical history, past social history, past surgical history and problem list.   Objective:   Vitals:   03/02/24 0936  BP: 112/75  Pulse: 87  Weight: 204 lb (92.5 kg)    Fetal Status: Fetal Heart Rate (bpm): 152 Fundal Height: 33 cm Movement: Present     General:  Alert, oriented and cooperative. Patient is in no acute distress.  Skin: Skin is warm and dry. No rash noted.   Cardiovascular: Normal heart rate noted  Respiratory: Normal respiratory effort, no problems with respiration noted  Abdomen: Soft, gravid, appropriate for gestational age.  Pain/Pressure: Absent      Assessment and Plan:  Pregnancy: G1P0000 at [redacted]w[redacted]d 1. Supervision of normal first pregnancy, antepartum (Primary) 2. [redacted] weeks gestation of pregnancy Completed water birth class, will send picture of certificate Meeting with  pediatrics  Reviewed birth plan [place] beach at 36 weeks - recommended looking up local maternity hospitals in case of emergency  Please refer to After Visit Summary for other counseling recommendations.   Return in about 2 weeks (around 03/16/2024) for return OB at 34 weeks.  Future Appointments  Date Time Provider Department Center  03/22/2024  3:30 PM Julianne Octave, MD [facility]  04/06/2024  3:30 PM Granville Layer, MD [facility]  04/13/2024  8:55 AM Thurmon Florida, Kathrine Paris, MD  [facility]  04/20/2024  9:35 AM Thurmon Florida, Kathrine Paris, MD [facility]  09/29/2024  4:00 PM Elta Halter, MD [facility] None   Izell Marsh, MD

## 2024-03-03 ENCOUNTER — Encounter: Payer: Self-pay | Admitting: Obstetrics and Gynecology

## 2024-03-03 LAB — SURGICAL PATHOLOGY

## 2024-03-08 ENCOUNTER — Ambulatory Visit: Payer: Self-pay | Admitting: Dermatology

## 2024-03-08 NOTE — Telephone Encounter (Signed)
-----   Message from Rexene Rattler sent at 03/08/2024 12:37 PM EDT ----- 1. Skin, left mid low back approximately 6 cm lateral to spine :       NO RESIDUAL DYSPLASTIC NEVUS, MARGINS FREE   Margins free post shave removal dysplastic nevus - please call patient ----- Message ----- From: Interface, [facility] Sent: 03/03/2024   3:55 PM EDT To: Rexene Rattler, MD

## 2024-03-08 NOTE — Telephone Encounter (Signed)
 Left pt msg advising pathology results./sh

## 2024-03-08 NOTE — Telephone Encounter (Signed)
 Justino Eleanor HERO, CNM to DERIANA VANDERHOEF     01/27/24  3:42 PM Hi Joesph,   It is common for the neutrophil count to be elevated in pregnancy. This is usually related to normal changes in the immune system and hormonal changes during pregnancy. No concerns!   Take care,   Missy  Last read by Joesph FORBES Sauers at 3:56PM on 01/27/2024.

## 2024-03-22 ENCOUNTER — Ambulatory Visit (INDEPENDENT_AMBULATORY_CARE_PROVIDER_SITE_OTHER): Admitting: Obstetrics & Gynecology

## 2024-03-22 ENCOUNTER — Encounter: Payer: Self-pay | Admitting: Obstetrics & Gynecology

## 2024-03-22 VITALS — BP 121/77 | HR 97 | Wt 208.0 lb

## 2024-03-22 DIAGNOSIS — Z3A35 35 weeks gestation of pregnancy: Secondary | ICD-10-CM

## 2024-03-22 DIAGNOSIS — Z3403 Encounter for supervision of normal first pregnancy, third trimester: Secondary | ICD-10-CM

## 2024-03-22 NOTE — Patient Instructions (Signed)

## 2024-03-22 NOTE — Progress Notes (Signed)
   PRENATAL VISIT NOTE  Subjective:  Bethany Aguilar is a 33 y.o. G1P0000 at [redacted]w[redacted]d being seen today for ongoing prenatal care.  She is currently monitored for the following issues for this low-risk pregnancy and has Supervision of normal pregnancy and Hydronephrosis of right kidney on their problem list.  Patient reports increase in vaginal discharge, no odor or irritation.  Contractions: Not present. Vag. Bleeding: None.  Movement: Present. Denies leaking of fluid.   The following portions of the patient's history were reviewed and updated as appropriate: allergies, current medications, past family history, past medical history, past social history, past surgical history and problem list.   Objective:    Vitals:   03/22/24 1534  BP: 121/77  Pulse: 97  Weight: 208 lb (94.3 kg)    Fetal Status:  Fetal Heart Rate (bpm): 148 Fundal Height: 35 cm Movement: Present    General: Alert, oriented and cooperative. Patient is in no acute distress.  Skin: Skin is warm and dry. No rash noted.   Cardiovascular: Normal heart rate noted  Respiratory: Normal respiratory effort, no problems with respiration noted  Abdomen: Soft, gravid, appropriate for gestational age.  Pain/Pressure: Absent     Pelvic: Cervical exam deferred        Extremities: Normal range of motion.  Edema: None  Mental Status: Normal mood and affect. Normal behavior. Normal judgment and thought content.   Assessment and Plan:  Pregnancy: G1P0000 at [redacted]w[redacted]d 1. [redacted] weeks gestation of pregnancy 2. Encounter for supervision of normal first pregnancy in third trimester (Primary) Reassured about physiologic vaginal discharge in third trimester.  No evaluation needed unless characteristics change. Preterm labor symptoms and general obstetric precautions including but not limited to vaginal bleeding, contractions, leaking of fluid and fetal movement were reviewed in detail with the patient. Please refer to After Visit Summary for  other counseling recommendations.   Return in about 1 week (around 03/29/2024) for OFFICE OB VISIT (MD or APP).  Future Appointments  Date Time Provider Department Center  04/06/2024  3:30 PM Fredirick Glenys RAMAN, MD [facility]  04/13/2024  8:55 AM Herchel, Gloris LABOR, MD [facility]  04/20/2024  9:35 AM Herchel, Gloris LABOR, MD [facility]  09/29/2024  4:00 PM Hester Alm BROCKS, MD [facility] None    Gloris Herchel, MD

## 2024-03-25 ENCOUNTER — Telehealth: Payer: Self-pay

## 2024-03-25 NOTE — Telephone Encounter (Signed)
 Left message for pt to call office back to get upcoming appt switched to see Dr. Eldonna

## 2024-04-06 ENCOUNTER — Encounter: Admitting: Family Medicine

## 2024-04-09 ENCOUNTER — Other Ambulatory Visit (HOSPITAL_COMMUNITY)
Admission: RE | Admit: 2024-04-09 | Discharge: 2024-04-09 | Disposition: A | Discharge status: Home / Self Care | Source: Ambulatory Visit | Attending: Family Medicine | Admitting: Family Medicine

## 2024-04-09 ENCOUNTER — Ambulatory Visit: Admitting: Family Medicine

## 2024-04-09 VITALS — BP 119/81 | HR 91 | Wt 209.0 lb

## 2024-04-09 DIAGNOSIS — Z3403 Encounter for supervision of normal first pregnancy, third trimester: Secondary | ICD-10-CM | POA: Diagnosis present

## 2024-04-09 DIAGNOSIS — Z3A38 38 weeks gestation of pregnancy: Secondary | ICD-10-CM

## 2024-04-09 DIAGNOSIS — Z1331 Encounter for screening for depression: Secondary | ICD-10-CM

## 2024-04-09 NOTE — Progress Notes (Signed)
   PRENATAL VISIT NOTE  Subjective:  Bethany Aguilar is a 33 y.o. G1P0000 at [redacted]w[redacted]d being seen today for ongoing prenatal care.  She is currently monitored for the following issues for this low-risk pregnancy and has Supervision of normal pregnancy and Hydronephrosis of right kidney on their problem list.  Patient reports no complaints.   . Vag. Bleeding: None.   . Denies leaking of fluid.   The following portions of the patient's history were reviewed and updated as appropriate: allergies, current medications, past family history, past medical history, past social history, past surgical history and problem list.   Objective:    There were no vitals filed for this visit.  Fetal Status:    Fundal Height: 38 cm      General: Alert, oriented and cooperative. Patient is in no acute distress.  Skin: Skin is warm and dry. No rash noted.   Cardiovascular: Normal heart rate noted  Respiratory: Normal respiratory effort, no problems with respiration noted  Abdomen: Soft, gravid, appropriate for gestational age.        Pelvic: Cervical exam deferred Dilation: Closed Effacement (%): 50 Station: -3  Extremities: Normal range of motion.     Mental Status: Normal mood and affect. Normal behavior. Normal judgment and thought content.   Assessment and Plan:  Pregnancy: G1P0000 at [redacted]w[redacted]d 1. Encounter for supervision of normal first pregnancy in third trimester (Primary) Up to date Fh appropriate Vigorous movement No questions about WB Reviewed when to come to hospital  2. [redacted] weeks gestation of pregnancy   Preterm labor symptoms and general obstetric precautions including but not limited to vaginal bleeding, contractions, leaking of fluid and fetal movement were reviewed in detail with the patient. Please refer to After Visit Summary for other counseling recommendations.   Return in about 1 week (around 04/16/2024) for Routine prenatal care.  Future Appointments  Date Time Provider  Department Center  04/16/2024 10:35 AM Eldonna Suzen Octave, MD [facility]  04/23/2024 11:15 AM Herchel, Gloris LABOR, MD [facility]  09/29/2024  4:00 PM Hester Alm BROCKS, MD [facility] None    Suzen Octave Eldonna, MD

## 2024-04-09 NOTE — Progress Notes (Signed)
 ROB: GBS today and cervix check

## 2024-04-11 LAB — STREP GP B NAA: Strep Gp B NAA: NEGATIVE

## 2024-04-12 ENCOUNTER — Ambulatory Visit: Payer: Self-pay | Admitting: Family Medicine

## 2024-04-12 LAB — CERVICOVAGINAL ANCILLARY ONLY
Chlamydia: NEGATIVE
Comment: NEGATIVE
Comment: NORMAL
Neisseria Gonorrhea: NEGATIVE

## 2024-04-13 ENCOUNTER — Encounter: Admitting: Obstetrics & Gynecology

## 2024-04-16 ENCOUNTER — Encounter: Payer: Self-pay | Admitting: Family Medicine

## 2024-04-16 ENCOUNTER — Ambulatory Visit: Admitting: Family Medicine

## 2024-04-16 VITALS — BP 123/80 | HR 80 | Wt 210.0 lb

## 2024-04-16 DIAGNOSIS — Z3403 Encounter for supervision of normal first pregnancy, third trimester: Secondary | ICD-10-CM | POA: Diagnosis not present

## 2024-04-16 NOTE — Progress Notes (Signed)
 ROB  Increase in discharge/yellow. Cervix check today

## 2024-04-16 NOTE — Progress Notes (Signed)
   PRENATAL VISIT NOTE  Subjective:  Bethany Aguilar is a 33 y.o. G1P0000 at [redacted]w[redacted]d being seen today for ongoing prenatal care.  She is currently monitored for the following issues for this low-risk pregnancy and has Supervision of normal pregnancy and Hydronephrosis of right kidney on their problem list.  Patient reports no complaints.  Contractions: Not present. Vag. Bleeding: None.  Movement: Present. Denies leaking of fluid.   The following portions of the patient's history were reviewed and updated as appropriate: allergies, current medications, past family history, past medical history, past social history, past surgical history and problem list.   Objective:    Vitals:   04/16/24 1046  BP: 123/80  Pulse: 80  Weight: 210 lb (95.3 kg)    Fetal Status:  Fetal Heart Rate (bpm): 144 Fundal Height: 39 cm Movement: Present Presentation: Vertex  General: Alert, oriented and cooperative. Patient is in no acute distress.  Skin: Skin is warm and dry. No rash noted.   Cardiovascular: Normal heart rate noted  Respiratory: Normal respiratory effort, no problems with respiration noted  Abdomen: Soft, gravid, appropriate for gestational age.  Pain/Pressure: Absent     Pelvic: Cervical exam performed in the presence of a chaperone Dilation: Closed (externally 1 cm) Effacement (%): 50 Station: -2  Extremities: Normal range of motion.  Edema: None  Mental Status: Normal mood and affect. Normal behavior. Normal judgment and thought content.   Assessment and Plan:  Pregnancy: G1P0000 at [redacted]w[redacted]d 1. Encounter for supervision of normal first pregnancy in third trimester (Primary) Up to date FH appropriate Desired cervical exam today Reviewed IOL at 41+ weeks-- discussed increased risk but low risk of stillbirth. Patient with otherwise low risk pregnancy. Recommended antenatal testing at 41 wk if goes past 41 wk 8/20 AM IOL desired. She is comfortable going past 41w and prefers this.  Reviewed  use of raspberry leaf tea, evening primrose and possible sweeping at future appts  Preterm labor symptoms and general obstetric precautions including but not limited to vaginal bleeding, contractions, leaking of fluid and fetal movement were reviewed in detail with the patient. Please refer to After Visit Summary for other counseling recommendations.   Return in about 1 week (around 04/23/2024) for Routine prenatal care.  Future Appointments  Date Time Provider Department Center  04/23/2024 11:15 AM Anyanwu, Gloris LABOR, MD [facility]  04/29/2024  2:30 PM Fredirick Glenys RAMAN, MD [facility]  05/05/2024  6:30 AM [facility] SCHED ROOM [facility] None  09/29/2024  4:00 PM Hester Alm BROCKS, MD [facility] None    Suzen Maryan Masters, MD

## 2024-04-20 ENCOUNTER — Encounter: Admitting: Obstetrics & Gynecology

## 2024-04-23 ENCOUNTER — Ambulatory Visit: Admitting: Obstetrics & Gynecology

## 2024-04-23 ENCOUNTER — Encounter: Payer: Self-pay | Admitting: Obstetrics & Gynecology

## 2024-04-23 VITALS — BP 118/87 | HR 87 | Wt 212.0 lb

## 2024-04-23 DIAGNOSIS — Z3A40 40 weeks gestation of pregnancy: Secondary | ICD-10-CM

## 2024-04-23 DIAGNOSIS — Z3403 Encounter for supervision of normal first pregnancy, third trimester: Secondary | ICD-10-CM

## 2024-04-23 DIAGNOSIS — O480 Post-term pregnancy: Secondary | ICD-10-CM

## 2024-04-23 NOTE — Progress Notes (Signed)
   PRENATAL VISIT NOTE  Subjective:  Bethany Aguilar is a 33 y.o. G1P0000 at [redacted]w[redacted]d being seen today for ongoing prenatal care.  She is currently monitored for the following issues for this low-risk pregnancy and has Supervision of normal pregnancy and Hydronephrosis of right kidney on their problem list.  Patient reports no complaints, wants cervical check.  Contractions: Not present. Vag. Bleeding: None.  Movement: Present. Denies leaking of fluid.   The following portions of the patient's history were reviewed and updated as appropriate: allergies, current medications, past family history, past medical history, past social history, past surgical history and problem list.   Objective:    Vitals:   04/23/24 1114  BP: 118/87  Pulse: 87  Weight: 212 lb (96.2 kg)    Fetal Status:  Fetal Heart Rate (bpm): 149 Fundal Height: 40 cm Movement: Present Presentation: Vertex  General: Alert, oriented and cooperative. Patient is in no acute distress.  Skin: Skin is warm and dry. No rash noted.   Cardiovascular: Normal heart rate noted  Respiratory: Normal respiratory effort, no problems with respiration noted  Abdomen: Soft, gravid, appropriate for gestational age.  Pain/Pressure: Present     Pelvic: Cervical exam performed in the presence of a chaperone Dilation: 3 Effacement (%): 60 Station: -3  Extremities: Normal range of motion.  Edema: None  Mental Status: Normal mood and affect. Normal behavior. Normal judgment and thought content.   Assessment and Plan:  Pregnancy: G1P0000 at [redacted]w[redacted]d 1. Post term pregnancy over 40 weeks (Primary) 2. Encounter for supervision of normal first pregnancy in third trimester Favorable cervix, patient reassured. Postdates BPP scheduled next week, IOL also scheduled next week. Labor symptoms and general obstetric precautions including but not limited to vaginal bleeding, contractions, leaking of fluid and fetal movement were reviewed in detail with the  patient. Please refer to After Visit Summary for other counseling recommendations.   Return in about 1 week (around 04/30/2024) for OB visits and antenatal testing as scheduled.  Future Appointments  Date Time Provider Department Center  04/29/2024 10:55 AM [facility]  04/29/2024  2:30 PM Fredirick Glenys RAMAN, MD [facility]  05/05/2024  6:30 AM [facility] SCHED ROOM [facility] None  09/29/2024  4:00 PM Hester Alm BROCKS, MD [facility] None    Gloris Hugger, MD

## 2024-04-23 NOTE — Patient Instructions (Signed)

## 2024-04-28 ENCOUNTER — Telehealth (HOSPITAL_COMMUNITY): Payer: Self-pay | Admitting: *Deleted

## 2024-04-28 NOTE — Telephone Encounter (Signed)
 Preadmission screen

## 2024-04-29 ENCOUNTER — Ambulatory Visit: Admitting: Family Medicine

## 2024-04-29 ENCOUNTER — Inpatient Hospital Stay (HOSPITAL_COMMUNITY)
Admission: RE | Admit: 2024-04-29 | Discharge: 2024-05-03 | DRG: 787 | Disposition: A | Discharge status: Home / Self Care | Attending: Obstetrics and Gynecology | Admitting: Obstetrics and Gynecology

## 2024-04-29 ENCOUNTER — Other Ambulatory Visit: Payer: Self-pay

## 2024-04-29 ENCOUNTER — Encounter: Payer: Self-pay | Admitting: Family Medicine

## 2024-04-29 ENCOUNTER — Ambulatory Visit (INDEPENDENT_AMBULATORY_CARE_PROVIDER_SITE_OTHER)

## 2024-04-29 ENCOUNTER — Inpatient Hospital Stay (HOSPITAL_COMMUNITY): Admission: AD | Admit: 2024-04-29 | Source: Home / Self Care | Admitting: Family Medicine

## 2024-04-29 ENCOUNTER — Encounter (HOSPITAL_COMMUNITY): Payer: Self-pay | Admitting: Family Medicine

## 2024-04-29 VITALS — BP 131/84 | HR 94 | Wt 214.0 lb

## 2024-04-29 DIAGNOSIS — Z833 Family history of diabetes mellitus: Secondary | ICD-10-CM | POA: Diagnosis not present

## 2024-04-29 DIAGNOSIS — N1330 Unspecified hydronephrosis: Secondary | ICD-10-CM | POA: Diagnosis present

## 2024-04-29 DIAGNOSIS — Z3403 Encounter for supervision of normal first pregnancy, third trimester: Principal | ICD-10-CM

## 2024-04-29 DIAGNOSIS — Z3A40 40 weeks gestation of pregnancy: Secondary | ICD-10-CM

## 2024-04-29 DIAGNOSIS — O480 Post-term pregnancy: Secondary | ICD-10-CM | POA: Diagnosis not present

## 2024-04-29 DIAGNOSIS — O99214 Obesity complicating childbirth: Secondary | ICD-10-CM | POA: Diagnosis present

## 2024-04-29 DIAGNOSIS — O4103X0 Oligohydramnios, third trimester, not applicable or unspecified: Principal | ICD-10-CM | POA: Diagnosis present

## 2024-04-29 DIAGNOSIS — O99892 Other specified diseases and conditions complicating childbirth: Secondary | ICD-10-CM | POA: Diagnosis present

## 2024-04-29 LAB — CBC
HCT: 38.2 % (ref 36.0–46.0)
Hemoglobin: 13.2 g/dL (ref 12.0–15.0)
MCH: 31 pg (ref 26.0–34.0)
MCHC: 34.6 g/dL (ref 30.0–36.0)
MCV: 89.7 fL (ref 80.0–100.0)
Platelets: 245 10*3/uL (ref 150–400)
RBC: 4.26 MIL/uL (ref 3.87–5.11)
RDW: 13.2 % (ref 11.5–15.5)
WBC: 14.7 10*3/uL — ABNORMAL HIGH (ref 4.0–10.5)
nRBC: 0 % (ref 0.0–0.2)

## 2024-04-29 LAB — TYPE AND SCREEN
ABO/RH(D): O NEG
Antibody Screen: POSITIVE

## 2024-04-29 MED ORDER — FENTANYL CITRATE (PF) 100 MCG/2ML IJ SOLN: 50.0000 ug | INTRAMUSCULAR | Status: DC | PRN

## 2024-04-29 MED ORDER — ACETAMINOPHEN 325 MG PO TABS: 650.0000 mg | ORAL_TABLET | ORAL | Status: DC | PRN

## 2024-04-29 MED ORDER — LIDOCAINE HCL (PF) 1 % IJ SOLN: 30.0000 mL | INTRAMUSCULAR | Status: DC | PRN

## 2024-04-29 MED ORDER — ONDANSETRON HCL 4 MG/2ML IJ SOLN
4.0000 mg | Freq: Four times a day (QID) | INTRAMUSCULAR | Status: DC | PRN
  Administered 2024-04-30: 4 mg via INTRAVENOUS
  Filled 2024-04-29: qty 2

## 2024-04-29 MED ORDER — LACTATED RINGERS IV SOLN: INTRAVENOUS | Status: DC

## 2024-04-29 MED ORDER — LACTATED RINGERS IV SOLN
500.0000 mL | INTRAVENOUS | Status: DC | PRN
  Administered 2024-04-29: 1000 mL via INTRAVENOUS

## 2024-04-29 MED ORDER — TERBUTALINE SULFATE 1 MG/ML IJ SOLN: 0.2500 mg | Freq: Once | INTRAMUSCULAR | Status: DC | PRN

## 2024-04-29 MED ORDER — FLEET ENEMA RE ENEM: 1.0000 | ENEMA | Freq: Every day | RECTAL | Status: DC | PRN

## 2024-04-29 MED ORDER — OXYTOCIN-SODIUM CHLORIDE 30-0.9 UT/500ML-% IV SOLN: 1.0000 m[IU]/min | INTRAVENOUS | Status: DC

## 2024-04-29 MED ORDER — OXYCODONE-ACETAMINOPHEN 5-325 MG PO TABS: 2.0000 | ORAL_TABLET | ORAL | Status: DC | PRN

## 2024-04-29 MED ORDER — SOD CITRATE-CITRIC ACID 500-334 MG/5ML PO SOLN
30.0000 mL | ORAL | Status: DC | PRN
  Administered 2024-04-30: 30 mL via ORAL
  Filled 2024-04-29: qty 30

## 2024-04-29 MED ORDER — OXYTOCIN-SODIUM CHLORIDE 30-0.9 UT/500ML-% IV SOLN: 2.5000 [IU]/h | INTRAVENOUS | Status: DC

## 2024-04-29 MED ORDER — OXYCODONE-ACETAMINOPHEN 5-325 MG PO TABS: 1.0000 | ORAL_TABLET | ORAL | Status: DC | PRN

## 2024-04-29 MED ORDER — MISOPROSTOL 25 MCG QUARTER TABLET: 25.0000 ug | ORAL_TABLET | Freq: Once | ORAL | Status: DC

## 2024-04-29 MED ORDER — HYDROXYZINE HCL 50 MG PO TABS: 50.0000 mg | ORAL_TABLET | Freq: Four times a day (QID) | ORAL | Status: DC | PRN

## 2024-04-29 MED ORDER — OXYTOCIN BOLUS FROM INFUSION: 333.0000 mL | Freq: Once | INTRAVENOUS | Status: DC

## 2024-04-29 MED ORDER — MISOPROSTOL 50MCG HALF TABLET: 50.0000 ug | ORAL_TABLET | Freq: Once | ORAL | Status: DC

## 2024-04-29 MED ORDER — ZOLPIDEM TARTRATE 5 MG PO TABS: 5.0000 mg | ORAL_TABLET | Freq: Every evening | ORAL | Status: DC | PRN

## 2024-04-29 NOTE — Progress Notes (Signed)
   PRENATAL VISIT NOTE  Subjective:  Bethany Aguilar is a 33 y.o. G1P0000 at [redacted]w[redacted]d being seen today for ongoing prenatal care.  She is currently monitored for the following issues for this low-risk pregnancy and has Supervision of normal pregnancy and Hydronephrosis of right kidney on their problem list.  Patient reports no complaints.  Contractions: Not present. Vag. Bleeding: None.  Movement: Present. Denies leaking of fluid.   The following portions of the patient's history were reviewed and updated as appropriate: allergies, current medications, past family history, past medical history, past social history, past surgical history and problem list.   Objective:    Vitals:   04/29/24 1450  BP: 131/84  Pulse: 94  Weight: 214 lb (97.1 kg)    Fetal Status:  Fetal Heart Rate (bpm): 150-153   Movement: Present    General: Alert, oriented and cooperative. Patient is in no acute distress.  Skin: Skin is warm and dry. No rash noted.   Cardiovascular: Normal heart rate noted  Respiratory: Normal respiratory effort, no problems with respiration noted  Abdomen: Soft, gravid, appropriate for gestational age.  Pain/Pressure: Absent     Pelvic: Cervical exam performed in the presence of a chaperone        Extremities: Normal range of motion.  Edema: None  Mental Status: Normal mood and affect. Normal behavior. Normal judgment and thought content.  NST:  Baseline: 140 bpm, Variability: Good {> 6 bpm), Accelerations: Reactive, and Decelerations: Variable: mild  Assessment and Plan:  Pregnancy: G1P0000 at [redacted]w[redacted]d 1. Post term pregnancy over 40 weeks (Primary) - Fetal nonstress test  2. Encounter for supervision of normal first pregnancy in third trimester Continue prenatal care.  3. [redacted] weeks gestation of pregnancy BPP 8/8 today AFI 4.56, with two 2cm pockets, and two 0 cm pockets. NST ok, after discussion with pt. She elects for IOL today.   Term labor symptoms and general obstetric  precautions including but not limited to vaginal bleeding, contractions, leaking of fluid and fetal movement were reviewed in detail with the patient. Please refer to After Visit Summary for other counseling recommendations.   Return in 1 week (on 05/06/2024).  Future Appointments  Date Time Provider Department Center  05/05/2024  6:30 AM [facility] SCHED ROOM [facility] None  09/29/2024  4:00 PM Hester Alm BROCKS, MD [facility] None    Glenys GORMAN Birk, MD

## 2024-04-29 NOTE — H&P (Signed)
 Bethany Aguilar is a 33 y.o. female, G1P0 at 40.6  weeks, presenting for IOL s/t oligo. Patient receives care at [facility] and was supervised for a low-risk pregnancy. Pregnancy and medical history significant for problems as listed below. She is GBS negative and expresses a desire to utilize coping skills for pain management.  She is anticipating a female infant and requests condoms for PP birth control method.     Patient Active Problem List   Diagnosis Date Noted   Hydronephrosis of right kidney 02/03/2024   Supervision of normal pregnancy 10/06/2023    History of present pregnancy:  Last evaluation:  04/29/2024 in office with Dr. IVAR Birk  BP: 131/84  Pulse: 94  Weight: 214 lb (97.1 kg)      Fetal Status:  Fetal Heart Rate (bpm): 150-153   Movement: Present  NST completed and reactive. BPP completed and 8/8, but AFI of 4.56 and 2 pockets with no fluid noted.        Clinical Staff Provider  Office Location  [place] Dating  04/23/2024, by Last Menstrual Period  Language  English Anatomy US   normal  Flu Vaccine  Declined Genetic Screen  NIPS: LR  TDaP vaccine   5/12 Hgb A1C or  GTT Early : Third trimester : normal  Covid     LAB RESULTS   Rhogam  01/26/24 O/Negative/-- (01/28 1110)  Blood Type O/Negative/-- (01/28 1110)   RSV   Antibody Negative (05/12 1003)  Feeding Plan Breast only Rubella 2.10 (01/28 1110)  Contraception Condoms RPR Non Reactive (05/12 1003)   Circumcision If applicable HBsAg Negative (01/28 1110)   Pediatrician  [facility]- Suzen Dixons HIV Non Reactive (05/12 1003)  Support Person Mckinleigh Schuchart Varicella Non Reactive (01/28 1110)  Prenatal Classes Yes GBS  (For PCN allergy, check sensitivities)       Hep C Non Reactive (01/28 1110)   BTL Consent   Pap       Diagnosis  Date Value Ref Range Status  11/04/2018     Final    NEGATIVE FOR INTRAEPITHELIAL LESIONS OR MALIGNANCY.    VBAC Consent   Hgb Electro         CF        SMA                OB  History     Gravida  1   Para  0   Term  0   Preterm  0   AB  0   Living  0      SAB  0   IAB  0   Ectopic  0   Multiple  0   Live Births  0             Past Medical History:  Diagnosis Date   Contraceptive management    Contraceptive management 10/03/2017   Dysplastic nevus 05/02/2016   mid back spinal below bra   Dysplastic nevus 11/06/2016   left low back lat flank   Dysplastic nevus 05/14/2018   right low back 4.0 cm lat to spine sup, right low back 4.0 cm lat to spine inf, right upper back 3.0 cm lat to spine   Dysplastic nevus 09/14/2018   left med breast, right lat epigastric   Dysplastic nevus 06/07/2020   R ant axillary fold, moderat atypia   Dysplastic nevus 06/07/2020   L scapula, moderate to severe atypia - shave removal    Dysplastic nevus 06/07/2020  L mid back, 6.0cm lat to spine, severe excised 08/08/20   Dysplastic nevus 06/07/2020   R sup medial scapula, moderate to severe atypia - shave removal    Dysplastic nevus 06/07/2020   R lower back, moderate atypia   Dysplastic nevus 08/01/2021   Right upper back paraspinal - Moderate   Dysplastic nevus 08/01/2021   Left upper back medial to mid scapula 4.0 cm lat to spine - Moderate to severe - Recheck on follow up   Dysplastic nevus 12/09/2022   R upper back paraspinal - mild   Dysplastic nevus 06/11/2023   L mid back 3.0cm lat to spine - severe, Excised 06/24/2023   Dysplastic nevus 06/11/2023   L upper back 3.0cm lat to spine   Dysplastic nevus 06/11/2023   R mid back lat infra scapular - moderate   Dysplastic nevus 12/29/2023   left mid low back approximately 6 cm lateral to spine - moderate to severe, shave removal 03/01/2024   Dysplastic nevus 12/29/2023   right mid calf - severe, but close to margin   Past Surgical History:  Procedure Laterality Date   SKIN BIOPSY     back, stomach and leg; atypical cells   Family History: family history includes Diabetes in her maternal  aunt and maternal grandfather; Skin cancer in her maternal grandfather and paternal grandmother; Throat cancer (age of onset: 26) in her maternal aunt. Social History:  reports that she has never smoked. She has never been exposed to tobacco smoke. She has never used smokeless tobacco. She reports that she does not currently use alcohol. She reports that she does not use drugs.   Prenatal Transfer Tool  Maternal Diabetes: No Genetic Screening: Normal Maternal Ultrasounds/Referrals: Normal Fetal Ultrasounds or other Referrals:  None Maternal Substance Abuse:  No Significant Maternal Medications:  None Significant Maternal Lab Results: Group B Strep negative   Maternal Assessment:  ROS: +Contractions, -LOF, -Vaginal Bleeding, +Fetal Movement  All other systems reviewed and negative.    No Known Allergies     Blood pressure 129/79, pulse 98, temperature 98.6 F (37 C), temperature source Oral, resp. rate 18, height 5' 4 (1.626 m), weight 95.6 kg, last menstrual period 07/18/2023, SpO2 99%.  Physical Exam Vitals and nursing note reviewed. Exam conducted with a chaperone present Quince, Charity fundraiser).  Constitutional:      Appearance: Normal appearance.  HENT:     Head: Normocephalic and atraumatic.  Eyes:     Conjunctiva/sclera: Conjunctivae normal.  Cardiovascular:     Rate and Rhythm: Normal rate.     Pulses: Normal pulses.     Heart sounds: Normal heart sounds.  Pulmonary:     Effort: Pulmonary effort is normal. No respiratory distress.     Breath sounds: Normal breath sounds.  Abdominal:     Palpations: Abdomen is soft.     Tenderness: There is no abdominal tenderness.     Comments: Gravid, Appears AGA  Genitourinary:    Comments: Dilation: 4.5 Effacement (%): 70 Station: -3 Exam by:: Dr. Eveline  Musculoskeletal:        General: Normal range of motion.     Cervical back: Normal range of motion.  Skin:    General: Skin is warm and dry.  Neurological:     Mental  Status: She is alert and oriented to person, place, and time.  Psychiatric:        Mood and Affect: Mood normal.        Behavior: Behavior normal.  Fetal Assessment: Leopolds: -Pelvis: Adequate per MD exam -EFW:  -Presentation: Vertex  FHR: 145 bpm, Mod Var, -Decels, +Accels UCs:  Q4min    Assessment IUP at 40.6 weeks Cat I FT IOL  Oligo GBS Negative  Plan: Reviewed induction including usage of pitocin  and AROM. -Discussed r/b of each intervention. -Patient and SO-Cole questions addressed. -Plan to start with pitocin  as appropriate. -Dr. DOROTHA Solomons aware of POC   Harlene LITTIE Mock, MSN 04/29/2024, 11:35 PM

## 2024-04-29 NOTE — MAU Note (Signed)
 Bethany Aguilar is a 33 y.o. at [redacted]w[redacted]d here in MAU reporting: was seen in the office today and was supposed to be a direct admit for IOL for oligo. Reports some cramping and lower back pain. Had membrane sweep earlier and reports some spotting but denies VB or LOF. +FM   LMP: NA Onset of complaint: NA Pain score: 3 - abdomen; 2 - back  Vitals:   04/29/24 1949  BP: 129/79  Pulse: 98  Resp: 18  Temp: 98.6 F (37 C)  SpO2: 99%     FHT: 140  Lab orders placed from triage: none

## 2024-04-29 NOTE — Progress Notes (Signed)
 ROB: Wants a membrane sweep

## 2024-04-30 ENCOUNTER — Inpatient Hospital Stay (HOSPITAL_COMMUNITY): Admitting: Anesthesiology

## 2024-04-30 ENCOUNTER — Encounter (HOSPITAL_COMMUNITY): Payer: Self-pay

## 2024-04-30 ENCOUNTER — Encounter (HOSPITAL_COMMUNITY): Payer: Self-pay | Admitting: Obstetrics & Gynecology

## 2024-04-30 ENCOUNTER — Encounter (HOSPITAL_COMMUNITY)
Admission: RE | Disposition: A | Discharge status: Home / Self Care | Payer: Self-pay | Source: Home / Self Care | Attending: Obstetrics and Gynecology

## 2024-04-30 DIAGNOSIS — O480 Post-term pregnancy: Secondary | ICD-10-CM | POA: Diagnosis not present

## 2024-04-30 DIAGNOSIS — Z3A40 40 weeks gestation of pregnancy: Secondary | ICD-10-CM

## 2024-04-30 DIAGNOSIS — O4103X0 Oligohydramnios, third trimester, not applicable or unspecified: Secondary | ICD-10-CM | POA: Diagnosis not present

## 2024-04-30 LAB — CREATININE, SERUM
Creatinine, Ser: 0.78 mg/dL (ref 0.44–1.00)
GFR, Estimated: >60 mL/min (ref >60)

## 2024-04-30 LAB — CBC
HCT: 35 % — ABNORMAL LOW (ref 36.0–46.0)
Hemoglobin: 12 g/dL (ref 12.0–15.0)
MCH: 30.8 pg (ref 26.0–34.0)
MCHC: 34.3 g/dL (ref 30.0–36.0)
MCV: 89.7 fL (ref 80.0–100.0)
Platelets: 209 K/uL (ref 150–400)
RBC: 3.9 MIL/uL (ref 3.87–5.11)
RDW: 13.2 % (ref 11.5–15.5)
WBC: 25.6 K/uL — ABNORMAL HIGH (ref 4.0–10.5)
nRBC: 0 % (ref 0.0–0.2)

## 2024-04-30 LAB — RPR: RPR Ser Ql: NONREACTIVE

## 2024-04-30 SURGERY — Surgical Case
Anesthesia: Epidural

## 2024-04-30 MED ORDER — PHENYLEPHRINE 80 MCG/ML (10ML) SYRINGE FOR IV PUSH (FOR BLOOD PRESSURE SUPPORT): 80.0000 ug | PREFILLED_SYRINGE | INTRAVENOUS | Status: DC | PRN

## 2024-04-30 MED ORDER — WITCH HAZEL-GLYCERIN EX PADS: 1.0000 | MEDICATED_PAD | CUTANEOUS | Status: DC | PRN

## 2024-04-30 MED ORDER — PHENYLEPHRINE 80 MCG/ML (10ML) SYRINGE FOR IV PUSH (FOR BLOOD PRESSURE SUPPORT)
80.0000 ug | PREFILLED_SYRINGE | INTRAVENOUS | Status: DC | PRN
  Filled 2024-04-30: qty 10

## 2024-04-30 MED ORDER — LIDOCAINE-EPINEPHRINE (PF) 2 %-1:200000 IJ SOLN
INTRAMUSCULAR | Status: AC
  Filled 2024-04-30: qty 20

## 2024-04-30 MED ORDER — LIDOCAINE HCL (PF) 1 % IJ SOLN
INTRAMUSCULAR | Status: DC | PRN
  Administered 2024-04-30 (×2): 5 mL via EPIDURAL

## 2024-04-30 MED ORDER — DIPHENHYDRAMINE HCL 50 MG/ML IJ SOLN: 12.5000 mg | INTRAMUSCULAR | Status: DC | PRN

## 2024-04-30 MED ORDER — PHENYLEPHRINE 80 MCG/ML (10ML) SYRINGE FOR IV PUSH (FOR BLOOD PRESSURE SUPPORT)
PREFILLED_SYRINGE | INTRAVENOUS | Status: DC | PRN
  Administered 2024-04-30 (×3): 160 ug via INTRAVENOUS
  Administered 2024-04-30: 80 ug via INTRAVENOUS

## 2024-04-30 MED ORDER — HYDROCODONE-ACETAMINOPHEN 5-325 MG PO TABS
1.0000 | ORAL_TABLET | ORAL | Status: DC | PRN
  Administered 2024-05-02: 1 via ORAL
  Filled 2024-04-30: qty 1

## 2024-04-30 MED ORDER — FENTANYL-BUPIVACAINE-NACL 0.5-0.125-0.9 MG/250ML-% EP SOLN
12.0000 mL/h | EPIDURAL | Status: DC | PRN
  Administered 2024-04-30: 12 mL/h via EPIDURAL
  Filled 2024-04-30: qty 250

## 2024-04-30 MED ORDER — PRENATAL MULTIVITAMIN CH
1.0000 | ORAL_TABLET | Freq: Every day | ORAL | Status: DC
  Administered 2024-05-01 – 2024-05-02 (×2): 1 via ORAL
  Filled 2024-04-30 (×2): qty 1

## 2024-04-30 MED ORDER — DEXMEDETOMIDINE HCL IN NACL 200 MCG/50ML IV SOLN
INTRAVENOUS | Status: DC | PRN
Start: 2024-04-30 — End: 2024-04-30
  Administered 2024-04-30: 16 ug via INTRAVENOUS

## 2024-04-30 MED ORDER — SIMETHICONE 80 MG PO CHEW: 80.0000 mg | CHEWABLE_TABLET | ORAL | Status: DC | PRN

## 2024-04-30 MED ORDER — LIDOCAINE-EPINEPHRINE (PF) 2 %-1:200000 IJ SOLN
INTRAMUSCULAR | Status: DC | PRN
  Administered 2024-04-30: 5 mL via EPIDURAL
  Administered 2024-04-30: 3 mL via EPIDURAL
  Administered 2024-04-30: 2 mL via EPIDURAL

## 2024-04-30 MED ORDER — TRANEXAMIC ACID-NACL 1000-0.7 MG/100ML-% IV SOLN
INTRAVENOUS | Status: DC | PRN
Start: 2024-04-30 — End: 2024-04-30
  Administered 2024-04-30: 1000 mg via INTRAVENOUS

## 2024-04-30 MED ORDER — FENTANYL CITRATE (PF) 100 MCG/2ML IJ SOLN: 25.0000 ug | INTRAMUSCULAR | Status: DC | PRN

## 2024-04-30 MED ORDER — MORPHINE SULFATE (PF) 0.5 MG/ML IJ SOLN
INTRAMUSCULAR | Status: DC | PRN
  Administered 2024-04-30: 3 mg via EPIDURAL

## 2024-04-30 MED ORDER — OXYTOCIN-SODIUM CHLORIDE 30-0.9 UT/500ML-% IV SOLN
INTRAVENOUS | Status: DC | PRN
  Administered 2024-04-30: 300 mL via INTRAVENOUS

## 2024-04-30 MED ORDER — DIPHENHYDRAMINE HCL 50 MG/ML IJ SOLN: 12.5000 mg | Freq: Four times a day (QID) | INTRAMUSCULAR | Status: DC | PRN

## 2024-04-30 MED ORDER — FENTANYL CITRATE (PF) 100 MCG/2ML IJ SOLN
INTRAMUSCULAR | Status: AC
Start: 2024-04-30 — End: 2024-04-30
  Filled 2024-04-30: qty 2

## 2024-04-30 MED ORDER — COCONUT OIL OIL
1.0000 | TOPICAL_OIL | Status: DC | PRN
  Administered 2024-05-02: 1 via TOPICAL

## 2024-04-30 MED ORDER — MENTHOL 3 MG MT LOZG: 1.0000 | LOZENGE | OROMUCOSAL | Status: DC | PRN

## 2024-04-30 MED ORDER — EPHEDRINE 5 MG/ML INJ: 10.0000 mg | INTRAVENOUS | Status: DC | PRN

## 2024-04-30 MED ORDER — ONDANSETRON HCL 4 MG/2ML IJ SOLN
INTRAMUSCULAR | Status: DC | PRN
  Administered 2024-04-30: 4 mg via INTRAVENOUS

## 2024-04-30 MED ORDER — FENTANYL CITRATE (PF) 100 MCG/2ML IJ SOLN
INTRAMUSCULAR | Status: DC | PRN
  Administered 2024-04-30: 100 ug via EPIDURAL

## 2024-04-30 MED ORDER — LACTATED RINGERS IV SOLN: INTRAVENOUS | Status: DC

## 2024-04-30 MED ORDER — DIPHENHYDRAMINE HCL 25 MG PO CAPS
25.0000 mg | ORAL_CAPSULE | Freq: Four times a day (QID) | ORAL | Status: DC | PRN
  Administered 2024-04-30: 25 mg via ORAL
  Filled 2024-04-30: qty 1

## 2024-04-30 MED ORDER — SIMETHICONE 80 MG PO CHEW
80.0000 mg | CHEWABLE_TABLET | Freq: Three times a day (TID) | ORAL | Status: DC
  Filled 2024-04-30 (×4): qty 1

## 2024-04-30 MED ORDER — DEXAMETHASONE SODIUM PHOSPHATE 10 MG/ML IJ SOLN
INTRAMUSCULAR | Status: DC | PRN
  Administered 2024-04-30: 10 mg via INTRAVENOUS

## 2024-04-30 MED ORDER — KETOROLAC TROMETHAMINE 30 MG/ML IJ SOLN: 30.0000 mg | Freq: Four times a day (QID) | INTRAMUSCULAR | Status: DC | PRN

## 2024-04-30 MED ORDER — DIPHENHYDRAMINE HCL 25 MG PO CAPS: 25.0000 mg | ORAL_CAPSULE | Freq: Four times a day (QID) | ORAL | Status: DC | PRN

## 2024-04-30 MED ORDER — SODIUM CHLORIDE 0.9 % IV SOLN
INTRAVENOUS | Status: DC | PRN
  Administered 2024-04-30: 500 mg via INTRAVENOUS

## 2024-04-30 MED ORDER — LACTATED RINGERS IV SOLN: 500.0000 mL | Freq: Once | INTRAVENOUS | Status: DC

## 2024-04-30 MED ORDER — ENOXAPARIN SODIUM 40 MG/0.4ML IJ SOSY
40.0000 mg | PREFILLED_SYRINGE | INTRAMUSCULAR | Status: DC
  Administered 2024-05-01 – 2024-05-03 (×3): 40 mg via SUBCUTANEOUS
  Filled 2024-04-30 (×3): qty 0.4

## 2024-04-30 MED ORDER — IBUPROFEN 600 MG PO TABS
600.0000 mg | ORAL_TABLET | Freq: Four times a day (QID) | ORAL | Status: DC
  Administered 2024-05-01 – 2024-05-03 (×7): 600 mg via ORAL
  Filled 2024-04-30 (×7): qty 1

## 2024-04-30 MED ORDER — DIBUCAINE (PERIANAL) 1 % EX OINT: 1.0000 | TOPICAL_OINTMENT | CUTANEOUS | Status: DC | PRN

## 2024-04-30 MED ORDER — SODIUM CHLORIDE 0.9% FLUSH: 3.0000 mL | INTRAVENOUS | Status: DC | PRN

## 2024-04-30 MED ORDER — OXYTOCIN-SODIUM CHLORIDE 30-0.9 UT/500ML-% IV SOLN
2.5000 [IU]/h | INTRAVENOUS | Status: AC
  Filled 2024-04-30: qty 500

## 2024-04-30 MED ORDER — MORPHINE SULFATE (PF) 0.5 MG/ML IJ SOLN
INTRAMUSCULAR | Status: AC
  Filled 2024-04-30: qty 10

## 2024-04-30 MED ORDER — NALOXONE HCL 0.4 MG/ML IJ SOLN: 0.4000 mg | INTRAMUSCULAR | Status: DC | PRN

## 2024-04-30 MED ORDER — KETOROLAC TROMETHAMINE 30 MG/ML IJ SOLN: 30.0000 mg | Freq: Once | INTRAMUSCULAR | Status: DC | PRN

## 2024-04-30 MED ORDER — KETOROLAC TROMETHAMINE 30 MG/ML IJ SOLN
30.0000 mg | Freq: Four times a day (QID) | INTRAMUSCULAR | Status: AC
  Administered 2024-04-30 – 2024-05-01 (×4): 30 mg via INTRAVENOUS
  Filled 2024-04-30 (×4): qty 1

## 2024-04-30 MED ORDER — SENNOSIDES-DOCUSATE SODIUM 8.6-50 MG PO TABS
2.0000 | ORAL_TABLET | ORAL | Status: DC
  Administered 2024-05-01 – 2024-05-03 (×3): 2 via ORAL
  Filled 2024-04-30 (×3): qty 2

## 2024-04-30 MED ORDER — ACETAMINOPHEN 500 MG PO TABS
1000.0000 mg | ORAL_TABLET | Freq: Four times a day (QID) | ORAL | Status: AC
  Administered 2024-05-01 (×3): 1000 mg via ORAL
  Filled 2024-04-30 (×3): qty 2

## 2024-04-30 MED ORDER — CEFAZOLIN SODIUM-DEXTROSE 2-3 GM-%(50ML) IV SOLR
INTRAVENOUS | Status: DC | PRN
  Administered 2024-04-30: 2 g via INTRAVENOUS

## 2024-04-30 MED ORDER — ONDANSETRON HCL 4 MG/2ML IJ SOLN: 4.0000 mg | Freq: Three times a day (TID) | INTRAMUSCULAR | Status: DC | PRN

## 2024-04-30 MED ORDER — ACETAMINOPHEN 10 MG/ML IV SOLN
INTRAVENOUS | Status: DC | PRN
  Administered 2024-04-30: 1000 mg via INTRAVENOUS

## 2024-04-30 SURGICAL SUPPLY — 33 items
BENZOIN TINCTURE PRP APPL 2/3 (GAUZE/BANDAGES/DRESSINGS) IMPLANT
CHLORAPREP W/TINT 26 (MISCELLANEOUS) ×2 IMPLANT
CLAMP UMBILICAL CORD (MISCELLANEOUS) ×1 IMPLANT
CLOTH BEACON ORANGE TIMEOUT ST (SAFETY) ×1 IMPLANT
DRSG OPSITE POSTOP 4X10 (GAUZE/BANDAGES/DRESSINGS) ×1 IMPLANT
ELECTRODE REM PT RTRN 9FT ADLT (ELECTROSURGICAL) ×1 IMPLANT
EXTRACTOR VACUUM KIWI (MISCELLANEOUS) IMPLANT
GAUZE SPONGE 4X4 12PLY STRL (GAUZE/BANDAGES/DRESSINGS) IMPLANT
GAUZE SPONGE 4X4 12PLY STRL LF (GAUZE/BANDAGES/DRESSINGS) IMPLANT
GLOVE BIOGEL PI IND STRL 6.5 (GLOVE) ×1 IMPLANT
GLOVE ECLIPSE 6.5 STRL STRAW (GLOVE) ×1 IMPLANT
GOWN STRL REUS W/TWL LRG LVL3 (GOWN DISPOSABLE) ×2 IMPLANT
KIT ABG SYR 3ML LUER SLIP (SYRINGE) IMPLANT
LIGASURE IMPACT 36 18CM CVD LR (INSTRUMENTS) ×1 IMPLANT
MAT PREVALON FULL STRYKER (MISCELLANEOUS) IMPLANT
NDL HYPO 25X5/8 SAFETYGLIDE (NEEDLE) IMPLANT
NEEDLE HYPO 22GX1.5 SAFETY (NEEDLE) IMPLANT
NEEDLE HYPO 25X5/8 SAFETYGLIDE (NEEDLE) ×2 IMPLANT
NS IRRIG 1000ML POUR BTL (IV SOLUTION) ×1 IMPLANT
PACK C SECTION WH (CUSTOM PROCEDURE TRAY) ×1 IMPLANT
PAD ABD 8X10 STRL (GAUZE/BANDAGES/DRESSINGS) IMPLANT
PAD OB MATERNITY 4.3X12.25 (PERSONAL CARE ITEMS) ×1 IMPLANT
RETAINER VISCERAL (MISCELLANEOUS) IMPLANT
RETRACTOR WND ALEXIS 25 LRG (MISCELLANEOUS) IMPLANT
STRIP CLOSURE SKIN 1/2X4 (GAUZE/BANDAGES/DRESSINGS) ×1 IMPLANT
SUT MON AB 4-0 PS1 27 (SUTURE) ×1 IMPLANT
SUT VIC AB 0 CT1 36 (SUTURE) ×1 IMPLANT
SUT VIC AB 0 CTX36XBRD ANBCTRL (SUTURE) ×2 IMPLANT
SUT VIC AB 2-0 CT1 TAPERPNT 27 (SUTURE) IMPLANT
SYR CONTROL 10ML LL (SYRINGE) IMPLANT
TOWEL OR 17X24 6PK STRL BLUE (TOWEL DISPOSABLE) ×2 IMPLANT
TRAY FOLEY W/BAG SLVR 14FR LF (SET/KITS/TRAYS/PACK) IMPLANT
WATER STERILE IRR 1000ML POUR (IV SOLUTION) ×1 IMPLANT

## 2024-04-30 NOTE — Lactation Note (Signed)
 This note was copied from a baby's chart. Lactation Consultation Note  Patient Name: Bethany Aguilar Unijb'd Date: 04/30/2024 Age:32 hours Reason for consult: Initial assessment;Primapara;1st time breastfeeding;Term  P1- MOB reports that infant has latched well so far with no concerns or complaints of pain. Per MOB, infant had just latched and MOB was too hot to try again. LC provided MOB with a fan and encouraged her to call for a latch assessment. LC reviewed the first 24 hr birthday nap, day 2 cluster feeding, feeding infant on cue 8-12x in 24 hrs, not allowing infant to go over 3 hrs without a feeding, CDC milk storage guidelines, LC services handout and engorgement/breast care. LC encouraged MOB to call for further assistance as needed.  Maternal Data Has patient been taught Hand Expression?: No Does the patient have breastfeeding experience prior to this delivery?: No  Feeding Mother's Current Feeding Choice: Breast Milk  Lactation Tools Discussed/Used Pump Education: Milk Storage  Interventions Interventions: Breast feeding basics reviewed;Education;LC Services brochure  Discharge Discharge Education: Engorgement and breast care;Warning signs for feeding baby Pump: DEBP;Personal  Consult Status Consult Status: Follow-up Date: 05/01/24 Follow-up type: In-patient    Recardo Hoit BS, IBCLC 04/30/2024, 10:17 PM

## 2024-04-30 NOTE — Discharge Summary (Signed)
 Postpartum Discharge Summary  Date of Service updated8/15/25     Patient Name: Bethany Aguilar DOB: 1991/03/27 MRN: [id number]  Date of admission: 04/29/2024 Delivery date:04/30/2024 Delivering provider: DUNN, KATHLEEN T Date of discharge: 04/30/2024  Admitting diagnosis: Post term pregnancy [O48.0] Intrauterine pregnancy: [redacted]w[redacted]d     Secondary diagnosis:  Principal Problem:   Post term pregnancy  Additional problems: Oligohydramnios, thick meconium    Discharge diagnosis: Term Pregnancy Delivered                                              Post partum procedures:{Postpartum procedures:23558} Augmentation: AROM Complications: None  Hospital course: Induction of Labor With Cesarean Section   33 y.o. yo G1P1001 at [redacted]w[redacted]d was admitted to the hospital 04/29/2024 for induction of labor. Patient had a labor course significant for persistently worsening Category II fetal heart tones with multiple prolonged and late decelerations. The patient went for cesarean section due to Non-Reassuring FHR. Delivery details are as follows: Membrane Rupture Time/Date: 8:37 AM,04/30/2024  Delivery Method:C-Section, Low Transverse Operative Delivery:N/A Details of operation can be found in separate operative Note.  Patient had a postpartum course complicated by***. She is ambulating, tolerating a regular diet, passing flatus, and urinating well.  Patient is discharged home in stable condition on 04/30/24.      Newborn Data: Birth date:04/30/2024 Birth time:7:09 PM Gender:Female Living status:Living Apgars:6 ,9  Weight:3940 g                               Magnesium Sulfate received: No BMZ received: No Rhophylac :*** FFM:{FFM:69559966} T-DaP:Given prenatally Flu: No RSV Vaccine received: No Transfusion:{Transfusion received:30440034}  Immunizations received: Immunization History  Administered Date(s) Administered   Tdap 09/24/2013, 01/26/2024    Physical exam  Vitals:   04/30/24 1801  04/30/24 1838 04/30/24 2015 04/30/24 2030  BP: 108/64 124/70 113/61 110/78  Pulse: (!) 106 95 (!) 168 89  Resp:   (!) 21 18  Temp: 98.6 F (37 C)  98.5 F (36.9 C)   TempSrc: Oral  Oral   SpO2:   93% 97%  Weight:      Height:       General: {Exam; general:21111117} Lochia: {Desc; appropriate/inappropriate:30686::appropriate} Uterine Fundus: {Desc; firm/soft:30687} Incision: {Exam; incision:21111123} DVT Evaluation: {Exam; dvt:2111122} Labs: Lab Results  Component Value Date   WBC 14.7 (H) 04/29/2024   HGB 13.2 04/29/2024   HCT 38.2 04/29/2024   MCV 89.7 04/29/2024   PLT 245 04/29/2024      Latest Ref Rng & Units 02/03/2024    8:45 AM  CMP  Glucose 70 - 99 mg/dL 97   BUN 6 - 20 mg/dL 7   Creatinine 9.55 - 8.99 mg/dL 9.58   Sodium 864 - 854 mmol/L 135   Potassium 3.5 - 5.1 mmol/L 3.6   Chloride 98 - 111 mmol/L 105   CO2 22 - 32 mmol/L 22   Calcium 8.9 - 10.3 mg/dL 8.4   Total Protein 6.5 - 8.1 g/dL 5.9   Total Bilirubin 0.0 - 1.2 mg/dL 0.7   Alkaline Phos 38 - 126 U/L 68   AST 15 - 41 U/L 19   ALT 0 - 44 U/L 20    Edinburgh Score:     No data to display  No data recorded  After visit meds:  Allergies as of 04/30/2024   No Known Allergies   Med Rec must be completed prior to using this [facility]***        Discharge home in stable condition Infant Feeding: Breast Infant Disposition:home with mother Discharge instruction: per After Visit Summary and Postpartum booklet. Activity: Advance as tolerated. Pelvic rest for 6 weeks.  Diet: routine diet Future Appointments: Future Appointments  Date Time Provider Department Center  09/29/2024  4:00 PM Hester Alm BROCKS, MD [facility] None   Follow up Visit: Message sent to [facility] 8/15  Please schedule this patient for a In person postpartum visit in 4 weeks with the following provider: Any provider. Additional Postpartum F/U:Incision check 1 week  Low risk pregnancy complicated by:  oligohydramnios and post-dates Delivery mode:  C-Section, Low Transverse Anticipated Birth Control:  Condoms   04/30/2024 Leeroy KATHEE Pouch, MD

## 2024-04-30 NOTE — Anesthesia Postprocedure Evaluation (Signed)
 Anesthesia Post Note  Patient: Bethany SARIN  Procedure(s) Performed: CESAREAN DELIVERY     Patient location during evaluation: PACU Anesthesia Type: Epidural Level of consciousness: awake Pain management: pain level controlled Vital Signs Assessment: post-procedure vital signs reviewed and stable Respiratory status: spontaneous breathing, nonlabored ventilation and respiratory function stable Cardiovascular status: stable Postop Assessment: no headache, no backache and epidural receding Anesthetic complications: no   No notable events documented.  Last Vitals:  Vitals:   04/30/24 2132 04/30/24 2234  BP: (!) 113/58 101/69  Pulse: 88 73  Resp: 17 17  Temp: 37.3 C 37.2 C  SpO2:      Last Pain:  Vitals:   04/30/24 2236  TempSrc:   PainSc: 0-No pain                 Bethany Aguilar

## 2024-04-30 NOTE — Procedures (Deleted)
 Bethany Aguilar  PROCEDURE DATE: 04/30/2024  PREOPERATIVE DIAGNOSES:  1) Intrauterine pregnancy at [redacted]w[redacted]d weeks gestation 2) non-reassuring fetal status  POSTOPERATIVE DIAGNOSES: The same  PROCEDURE: Primary Low Transverse Cesarean Section  SURGEON:  Dr. Rollo T. Abigail, MD  ASSISTANT:  Leeroy Pouch, MD, Mardy Shropshire, MD  ANESTHESIOLOGY TEAM: Anesthesiologist: Peggye Delon Brunswick, MD; Merla Almarie CHRISTELLA ROSALEA; Jerrye Sharper, MD CRNA: Thomos Janetta DEL, CRNA  INDICATIONS: Bethany Aguilar is a 33 y.o. G1P1001 at [redacted]w[redacted]d here for cesarean section secondary to the indications listed under preoperative diagnoses; please see preoperative note for further details. The patient concurred with the proposed plan, giving informed written consent for the procedure.    FINDINGS:   Viable female infant in cephalic presentation.   Apgars 6 and 9.   Amniotic fluid: thick meconium.   Intact placenta, three vessel cord.   Normal uterus, fallopian tubes and ovaries bilaterally.  ANESTHESIA: epidural  INTRAVENOUS FLUIDS: 1000 ml    ESTIMATED BLOOD LOSS: 550 ml  URINE OUTPUT:  200 ml  SPECIMENS: Placenta sent to pathology   COMPLICATIONS: None immediate  PROCEDURE IN DETAIL:  The patient preoperatively received intravenous antibiotics and had sequential compression devices applied to her lower extremities.  She was then taken to the operating room where the epidural was dosed up to a surgical level and found to be adequate. She was then placed in a dorsal supine position with a leftward tilt, and prepped and draped in a sterile manner.  A foley catheter was  was already in place.  After an adequate timeout was performed, a Pfannenstiel skin incision was made with scalpel and carried through to the underlying layer of fascia. The fascia was incised in the midline, and this incision was extended bluntly. The rectus muscles and fascia were separated in the midline. The peritoneum  was entered bluntly.   The Alexis self-retaining retractor was introduced into the abdominal cavity.  Attention was turned to the lower uterine segment. A bladder flap was made with Metzenbaum scissors and extended bluntly.  Low transverse hysterotomy was made with a scalpel and uterus entered with a hemostat. The hysterotomy was extended bilaterally bluntly.  The infant delivery attempted by myself with difficulty, with second attempt by Dr. Leveque the infant was successfully delivered. Thick meconium was noted. The cord was clamped and cut immediately, and the infant was handed over to the awaiting neonatology team. Uterine massage was then administered, and the placenta delivered intact with a three-vessel cord. The uterus was then cleared of clots and debris.  The hysterotomy was closed with 0 Vicryl in a running fashion followed by a second imbricating layer of 0-vicryl.   The pelvis was cleared of all clot and debris after irrigation.  The retractor was removed. Hemostasis was confirmed on all surfaces. The fascia was then closed using 0 Vicryl in a running fashion.  The subcutaneous layer was irrigated, and was found to be hemostatic and was reapproximated with 2-0 vicryl in a running fashion. The skin was closed with a 4-0 monocryl subcuticular stitch. The patient tolerated the procedure well. Sponge, instrument and needle counts were correct x 3.  She was taken to the recovery room in stable condition.   Leeroy Pouch, MD Family Medicine-OB Fellow Center for [facility], [facility]

## 2024-04-30 NOTE — Transfer of Care (Signed)
 Immediate Anesthesia Transfer of Care Note  Patient: Bethany Aguilar  Procedure(s) Performed: CESAREAN DELIVERY  Patient Location: PACU  Anesthesia Type:Epidural  Level of Consciousness: awake, alert , and oriented  Airway & Oxygen Therapy: Patient Spontanous Breathing  Post-op Assessment: Report given to RN and Post -op Vital signs reviewed and stable  Post vital signs: Reviewed and stable  Last Vitals:  Vitals Value Taken Time  BP 113/61 04/30/24 20:15  Temp    Pulse 88 04/30/24 20:20  Resp 16 04/30/24 20:20  SpO2 97 % 04/30/24 20:20  Vitals shown include unfiled device data.  Last Pain:  Vitals:   04/30/24 1801  TempSrc: Oral  PainSc:          Complications: No notable events documented.

## 2024-04-30 NOTE — Anesthesia Procedure Notes (Signed)
 Epidural Patient location during procedure: OB Start time: 04/30/2024 6:50 AM End time: 04/30/2024 6:59 AM  Staffing Anesthesiologist: Jerrye Sharper, MD Performed: anesthesiologist   Preanesthetic Checklist Completed: patient identified, IV checked, site marked, risks and benefits discussed, surgical consent, monitors and equipment checked, pre-op evaluation and timeout performed  Epidural Patient position: sitting Prep: DuraPrep and site prepped and draped Patient monitoring: continuous pulse ox and blood pressure Approach: midline Location: L3-L4 Injection technique: LOR air  Needle:  Needle type: Tuohy  Needle gauge: 17 G Needle length: 9 cm and 9 Needle insertion depth: 5 cm cm Catheter type: closed end flexible Catheter size: 19 Gauge Catheter at skin depth: 10 cm Test dose: negative and Other  Assessment Events: blood not aspirated, no cerebrospinal fluid, injection not painful, no injection resistance, no paresthesia and negative IV test  Additional Notes Patient identified. Risks and benefits discussed including failed block, incomplete  Pain control, post dural puncture headache, nerve damage, paralysis, blood pressure Changes, nausea, vomiting, reactions to medications-both toxic and allergic and post Partum back pain. All questions were answered. Patient expressed understanding and wished to proceed. Sterile technique was used throughout procedure. Epidural site was Dressed with sterile barrier dressing. No paresthesias, signs of intravascular injection Or signs of intrathecal spread were encountered.  Patient was more comfortable after the epidural was dosed. Please see RN's note for documentation of vital signs and FHR which are stable.   Reason for block:procedure for pain

## 2024-04-30 NOTE — Progress Notes (Signed)
 Labor Progress Note Bethany Aguilar is a 33 y.o. G1P0000 at [redacted]w[redacted]d presented for IOL for oligohydramnios  S: Blocked and comfortable. Verbally consented to AROM. Risks and benefits discussed including small risk of cord prolapse and need for emergency c-section.  O:  BP 117/64   Pulse 83   Temp 98 F (36.7 C) (Oral)   Resp 16   Ht 5' 4 (1.626 m)   Wt 95.6 kg   LMP 07/18/2023 (Exact Date)   SpO2 100%   BMI 36.18 kg/m   Time: 0835  FHT: baseline 155 bpm, moderate variability, + accelerations, no decelerations,   Contractions: q 2-3 mins,   CVE: Dilation: 4.5 Effacement (%): 90 Cervical Position: Anterior Station: -2 Presentation: Vertex Exam by:: Dr. Trudy AROM with moderate meconium  A&P: 33 y.o. G1P0000 [redacted]w[redacted]d  #Labor: Progressing well. S/p AROM w meconium #Pain: epidural #FWB: Cat I #GBS negative   Leeroy KATHEE Trudy, MD 9:29 AM

## 2024-04-30 NOTE — Progress Notes (Signed)
 LABOR PROGRESS NOTE  Patient Name: Bethany Aguilar, female   DOB: Jul 13, 1991, 33 y.o.  MRN: [id number]  Called to room for prolonged decel to the 80s. 3rd episode.   Blood pressure 108/64, pulse (!) 106, temperature 98.6 F (37 C), temperature source Oral, resp. rate 16, height 5' 4 (1.626 m), weight 95.6 kg, last menstrual period 07/18/2023, SpO2 100%.  Dilation: 9 Effacement (%): 100 Cervical Position: Anterior Station: Plus 1 Presentation: Vertex Exam by:: Dorothyann Chester RN  The risks of cesarean section were discussed with the patient; including but not limited to: infection which may require antibiotics; bleeding which may require transfusion or re-operation; injury to bowel, bladder, ureters or other surrounding organs; injury to the fetus; need for additional procedures including hysterectomy in the event of a life-threatening hemorrhage; placental abnormalities wth subsequent pregnancies,  risk of needing c-sections in future pregnancies, incisional problems, thromboembolic phenomenon and other postoperative/anesthesia complications. Answered all questions. The patient verbalized understanding of the plan, giving informed consent for the procedure. She is agreeable to blood transfusion in the event of emergency.  Patient has been NPO since 0600 8/14, she will remain NPO for procedure Anesthesia, Dr. Abigail and OR aware Preoperative prophylactic antibiotics and SCDs ordered on call to the OR  To OR when ready, urgent   Mardy Shropshire, MD

## 2024-04-30 NOTE — Op Note (Addendum)
 Bethany Aguilar   PROCEDURE DATE: 04/30/2024   PREOPERATIVE DIAGNOSES:  1) Intrauterine pregnancy at [redacted]w[redacted]d weeks gestation 2) non-reassuring fetal status   POSTOPERATIVE DIAGNOSES: The same   PROCEDURE: Primary Low Transverse Cesarean Section   SURGEON:  Dr. Rollo T. Abigail, MD   ASSISTANT:  Leeroy Pouch, MD, Mardy Shropshire, MD   ANESTHESIOLOGY TEAM: Anesthesiologist: Peggye Delon Brunswick, MD; Merla Almarie CHRISTELLA ROSALEA; Jerrye Sharper, MD CRNA: Thomos Janetta DEL, CRNA   INDICATIONS: Bethany Aguilar is a 33 y.o. G1P1001 at [redacted]w[redacted]d here for cesarean section secondary to the indications listed under preoperative diagnoses; please see preoperative note for further details. The patient concurred with the proposed plan, giving informed written consent for the procedure.     FINDINGS:   Viable female infant in cephalic presentation.   Apgars 6 and 9.   Amniotic fluid: thick meconium.   Intact placenta, three vessel cord.   Normal uterus, fallopian tubes and ovaries bilaterally.   ANESTHESIA: epidural   INTRAVENOUS FLUIDS: 1000 ml     ESTIMATED BLOOD LOSS: 550 ml   URINE OUTPUT:  200 ml   SPECIMENS: Placenta sent to pathology    COMPLICATIONS: None immediate   PROCEDURE IN DETAIL:  The patient preoperatively received intravenous antibiotics and had sequential compression devices applied to her lower extremities.  She was then taken to the operating room where the epidural was dosed up to a surgical level and found to be adequate. She was then placed in a dorsal supine position with a leftward tilt, and prepped and draped in a sterile manner.  A foley catheter was  was already in place.   After an adequate timeout was performed, a Pfannenstiel skin incision was made with scalpel and carried through to the underlying layer of fascia. The fascia was incised in the midline, and this incision was extended bluntly. The rectus muscles and fascia were separated in the  midline. The peritoneum was entered bluntly.    The Alexis self-retaining retractor was introduced into the abdominal cavity.  Attention was turned to the lower uterine segment. A bladder flap was made with Metzenbaum scissors and extended bluntly.  Low transverse hysterotomy was made with a scalpel and uterus entered with a hemostat. The hysterotomy was extended bilaterally bluntly.  The infant delivery attempted by myself with difficulty, with second attempt by Dr. Leveque the infant was successfully delivered. Thick meconium was noted. The cord was clamped and cut immediately, and the infant was handed over to the awaiting neonatology team. Uterine massage was then administered, and the placenta delivered intact with a three-vessel cord. The uterus was then cleared of clots and debris.  The hysterotomy was closed with 0 Vicryl in a running fashion followed by a second imbricating layer of 0-vicryl.    The pelvis was cleared of all clot and debris after irrigation.  The retractor was removed. Hemostasis was confirmed on all surfaces. The fascia was then closed using 0 Vicryl in a running fashion.  The subcutaneous layer was irrigated, and was found to be hemostatic and was reapproximated with 2-0 vicryl in a running fashion. The skin was closed with a 4-0 monocryl subcuticular stitch. The patient tolerated the procedure well. Sponge, instrument and needle counts were correct x 3.  She was taken to the recovery room in stable condition.    Leeroy Pouch, MD Family Medicine-OB Fellow Center for [facility], [facility]   This note was originally entered as a wrong note type. Note was  copied here, and operative note in wrong note type was deleted.

## 2024-04-30 NOTE — Progress Notes (Addendum)
 L&D Note Called to see patient for 64m long decel, not on pit. Pt s/p AROM at around 0930 today and being induced for oligo and had some decels overnight while awaiting for space on L&D.  Pt not on pitocin . Cervix a little more dilated on RN exam at 6cm from 5-6/90/-1 at 1130 (same examiner). Pt laying on her right side with epidural in place and BPs reading low due to cuff placement. Pt comfortable with epidural  Continue to hold off on pitocin  for now and follow up BPs. Baby currently 155, no accel or decel, mod variability, q2-36m UCs.   Bebe Izell Raddle MD Attending Center for [employer] (Faculty Practice) 04/30/2024 Time: [phone]

## 2024-04-30 NOTE — Anesthesia Preprocedure Evaluation (Signed)
 Anesthesia Evaluation  Patient identified by MRN, date of birth, ID band Patient awake    Reviewed: Allergy & Precautions, Patient's Chart, lab work & pertinent test results  Airway Mallampati: II       Dental no notable dental hx.    Pulmonary neg pulmonary ROS   Pulmonary exam normal        Cardiovascular negative cardio ROS Normal cardiovascular exam Rhythm:Regular     Neuro/Psych negative neurological ROS  negative psych ROS   GI/Hepatic negative GI ROS, Neg liver ROS,,,  Endo/Other  Obesity  Renal/GU Renal diseaseHydronephrosis right   negative genitourinary   Musculoskeletal negative musculoskeletal ROS (+)    Abdominal  (+) + obese  Peds  Hematology negative hematology ROS (+)   Anesthesia Other Findings   Reproductive/Obstetrics (+) Pregnancy                              Anesthesia Physical Anesthesia Plan  ASA: 2  Anesthesia Plan: Epidural   Post-op Pain Management:    Induction:   PONV Risk Score and Plan: Treatment may vary due to age or medical condition  Airway Management Planned: Natural Airway  Additional Equipment: Fetal Monitoring and None  Intra-op Plan:   Post-operative Plan:   Informed Consent: I have reviewed the patients History and Physical, chart, labs and discussed the procedure including the risks, benefits and alternatives for the proposed anesthesia with the patient or authorized representative who has indicated his/her understanding and acceptance.       Plan Discussed with: Anesthesiologist  Anesthesia Plan Comments:          Anesthesia Quick Evaluation

## 2024-04-30 NOTE — Progress Notes (Signed)
 Patient ID: SIGNA CHEEK, female   DOB: Mar 26, 1991, 33 y.o.   MRN: [id number]  Subjective: -Nurse reports patient declines pitocin  and AROM currently. Provider to bedside. Patient coping well with contractions. Reports they have increased in intensity since cervical exam. Endorses fetal movement. SO at bedside, supportive.   Objective: BP 113/67   Pulse 83   Temp (!) (P) 97.1 F (36.2 C) (Axillary)   Resp 18   Ht 5' 4 (1.626 m)   Wt 95.6 kg   LMP 07/18/2023 (Exact Date)   SpO2 99%   BMI 36.18 kg/m  No intake/output data recorded. No intake/output data recorded.  Fetal Monitoring: FHT: 145 bpm, Mod Var, -Decels, +Accels UC: Irritability Noted    Vaginal Exam: SVE:   Dilation: 4.5 Effacement (%): 70 Station: -2 Exam by:: Camie Lao RN Membranes:Intact Internal Monitors: None  Augmentation/Induction: Pitocin :None Cytotec : None  Assessment:  IUP at 41 weeks Cat I FT  Oligo  Plan: -Support given for continued expectant management. -Informed that we will reassess ~ 3 hrs from previous check and discuss appropriateness of interventions.  -Patient agreeable and without questions.  -Continue other mgmt as ordered   Harlene LITTIE Synthia LAFE, CNM Advanced Practice Provider, Center for [facility] 04/30/2024, 3:46 AM

## 2024-04-30 NOTE — Progress Notes (Signed)
 Labor Progress Note Late Entry Bethany Aguilar is a 33 y.o. G1P0000 at [redacted]w[redacted]d presented for IOL for Oligohydramnios  S: Feeling comfortable. Sleepy.  O:  BP (!) 92/46   Pulse 87   Temp 98.7 F (37.1 C) (Oral)   Resp 18   Ht 5' 4 (1.626 m)   Wt 95.6 kg   LMP 07/18/2023 (Exact Date)   SpO2 100%   BMI 36.18 kg/m   1655 CVE: Dilation: 8.5 Effacement (%): 90 Cervical Position: Anterior Station: 0 Presentation: Vertex Exam by:: Dr. Leeroy Aguilar  Time: 1730  FHT: baseline 165 bpm, moderate variability, no accelerations, multiple late decelerations.  Contractions: q 1-2 mins,     A&P: 33 y.o. G1P0000 [redacted]w[redacted]d  IOL for oligohydramnios #Labor: Progressing well. Expectant management #Pain: epidural #FWB: Cat II with multiple late decelerations, however good variability is reassuring and progressing well #GBS negative Mild right hydronephrosis @ [redacted]wks pregnant (chronic/other problems)  Bethany KATHEE Pouch, MD 5:30 PM

## 2024-05-01 LAB — CBC
HCT: 22.9 % — ABNORMAL LOW (ref 36.0–46.0)
Hemoglobin: 7.7 g/dL — ABNORMAL LOW (ref 12.0–15.0)
MCH: 31.3 pg (ref 26.0–34.0)
MCHC: 33.6 g/dL (ref 30.0–36.0)
MCV: 93.1 fL (ref 80.0–100.0)
Platelets: 161 K/uL (ref 150–400)
RBC: 2.46 MIL/uL — ABNORMAL LOW (ref 3.87–5.11)
RDW: 13.5 % (ref 11.5–15.5)
WBC: 16.6 K/uL — ABNORMAL HIGH (ref 4.0–10.5)
nRBC: 0 % (ref 0.0–0.2)

## 2024-05-01 MED ORDER — SODIUM CHLORIDE 0.9 % IV SOLN: INTRAVENOUS | Status: AC | PRN

## 2024-05-01 MED ORDER — RHO D IMMUNE GLOBULIN 1500 UNIT/2ML IJ SOSY
300.0000 ug | PREFILLED_SYRINGE | Freq: Once | INTRAMUSCULAR | Status: AC
  Administered 2024-05-01: 300 ug via INTRAVENOUS
  Filled 2024-05-01: qty 2

## 2024-05-01 MED ORDER — SODIUM CHLORIDE 0.9 % IV SOLN
500.0000 mg | Freq: Once | INTRAVENOUS | Status: AC
  Administered 2024-05-01: 500 mg via INTRAVENOUS
  Filled 2024-05-01: qty 25

## 2024-05-01 MED ORDER — SODIUM CHLORIDE 0.9 % IV SOLN: 3.0000 mg/kg | Freq: Once | INTRAVENOUS | Status: DC

## 2024-05-01 MED ORDER — IRON SUCROSE 500 MG IVPB - SIMPLE MED
500.0000 mg | Freq: Once | INTRAVENOUS | Status: DC
  Filled 2024-05-01: qty 275

## 2024-05-01 NOTE — Addendum Note (Signed)
 Addendum  created 05/01/24 0254 by Peggye Delon Brunswick, MD   Attestation recorded in Intraprocedure, Intraprocedure Attestations filed

## 2024-05-01 NOTE — Lactation Note (Addendum)
 This note was copied from a baby's chart. Lactation Consultation Note  Patient Name: Girl Kanna Dafoe Unijb'd Date: 05/01/2024 Age:32 hours  Reason for consult: Follow-up assessment;Term;Primapara;1st time breastfeeding  P1, [redacted]w[redacted]d  Follow up LC visit to see P1 mother and her baby girl. Baby was alert and calm in mother's arms. Mother reports baby has been breastfeeding well and recently breast fed. Mother reports her nurse last night and LC were very helpful when assisting her with breastfeeding.   Mother denies any questions or concerns at his time. Mother encouraged to latch baby with feeding cues, place baby skin to skin if not latching, and call for assistance with breastfeeding as needed. Anticipate as baby approaches 24 hours of age, baby will breastfeed more often. It is typical for baby to feed 8-12 plus times in 24 hours and want to frequently over several of time referred to as cluster feeding.     Feeding Mother's Current Feeding Choice: Breast Milk   Interventions Interventions: Breast feeding basics reviewed;Education   Consult Status Consult Status: Follow-up Date: 05/02/24 Follow-up type: In-patient    Joshua Line M 05/01/2024, 12:40 PM

## 2024-05-01 NOTE — Progress Notes (Signed)
 Notified of Low CBC from 12. Patient asymptomatic at this time. Plan for IV iron  supplements while inpatient. No allergies to medications   CBC    Latest Ref Rng & Units 05/01/2024    5:55 AM 04/30/2024    9:44 PM 04/29/2024    8:49 PM  CBC  WBC 4.0 - 10.5 K/uL 16.6  25.6  14.7   Hemoglobin 12.0 - 15.0 g/dL 7.7  87.9  86.7   Hematocrit 36.0 - 46.0 % 22.9  35.0  38.2   Platelets 150 - 400 K/uL 161  209  245    Bethany Leggitt (Shay) Emilio, MSN, CNM  Center for [facility]  05/01/2024 3:14 PM

## 2024-05-01 NOTE — Progress Notes (Signed)
 Patient ID: Bethany Aguilar, female   DOB: October 26, 1990, 33 y.o.   MRN: [id number]  Postpartum Day One S/P Primary Cesarean Section due to NR fetal status  Subjective: Patient up ad lib, denies syncope or dizziness. Reports consuming regular diet without issues and denies N/V. Patient reports no bowel movement and is  passing flatus.  Denies issues with urination and reports bleeding is good per the nurses.  Patient is breastfeeding and reports going well.  Desires no method for postpartum contraception.  Pain is being appropriately managed with use of toradol  and tylenol .   Objective: Temp:  [98 F (36.7 C)-99.1 F (37.3 C)] 98.3 F (36.8 C) (08/16 0538) Pulse Rate:  [63-168] 63 (08/16 0538) Resp:  [16-21] 17 (08/16 0538) BP: (90-130)/(38-91) 90/56 (08/16 0538) SpO2:  [93 %-100 %] 97 % (08/15 2115)  Recent Labs    04/29/24 2049 04/30/24 2144  HGB 13.2 12.0  HCT 38.2 35.0*  WBC 14.7* 25.6*    Physical Exam:  General: alert, cooperative, and no distress Mood/Affect: Appropriate/Appropriate Lungs: clear to auscultation, no wheezes, rales or rhonchi, symmetric air entry.  Heart: normal rate and regular rhythm. Breast: Nipples intact. Abdomen:  + bowel sounds, Soft, Appropriate tender Incision: Pressure dressing intact,  Uterine Fundus: firm Lochia: appropriate Skin: Warm, Dry. DVT Evaluation: No evidence of DVT seen on physical exam. No significant calf/ankle edema. SCDs in place JP drain:   None  Assessment Post Operative Day One S/P Primary C/S Normal Involution BreastFeeding Hemodynamically Stable  Plan: -Discussed ambulation.  -Encouraged usage of pain medication when needed.  -Reviewed advancement of diet.  -Discussed potential discharge tomorrow.  -Continue other mgmt as ordered -L&D team to be updated on patient status  [name] MSN, CNM 05/01/2024, 6:51 AM

## 2024-05-02 ENCOUNTER — Other Ambulatory Visit: Payer: Self-pay

## 2024-05-02 LAB — RH IG WORKUP (INCLUDES ABO/RH)
Fetal Screen: NEGATIVE
Gestational Age(Wks): 41
Unit division: 0

## 2024-05-02 MED ORDER — IBUPROFEN 600 MG PO TABS: 600.0000 mg | ORAL_TABLET | Freq: Four times a day (QID) | ORAL | 0 refills | Status: DC

## 2024-05-02 MED ORDER — HYDROCODONE-ACETAMINOPHEN 5-325 MG PO TABS: 1.0000 | ORAL_TABLET | ORAL | 0 refills | Status: DC | PRN

## 2024-05-02 MED ORDER — ACCRUFER 30 MG PO CAPS: 1.0000 | ORAL_CAPSULE | Freq: Every day | ORAL | 5 refills | Status: DC

## 2024-05-02 MED ORDER — SENNOSIDES-DOCUSATE SODIUM 8.6-50 MG PO TABS: 2.0000 | ORAL_TABLET | ORAL | 0 refills | Status: DC

## 2024-05-02 NOTE — Lactation Note (Signed)
 This note was copied from a baby's chart. Lactation Consultation Note  Patient Name: Bethany Aguilar Unijb'd Date: 05/02/2024 Age:33 years Reason for consult: Follow-up assessment;Primapara;1st time breastfeeding;Term;Breastfeeding assistance (no stool from infant)  P1- Infant has not stooled since birth, so the NP had created an care plan to start supplementation if infant had not stooled by tonight, and then an enema if she has not stooled by tomorrow morning. LC first checked the diapers in the trash to ensure none were missed. LC did not see any stool diapers, only urine. MOB had just finished latching infant to the left breast in the cross cradle hold. LC encouraged latching infant to the right breast as well. Infant was eager and latched immediately. Infant had flanged lips, a strong rhythmic pull and multiple swallows were noted. Infant nursed for 25 minutes, then MOB took her off so LC could teach them how to pace feed infant. As FOB was pace feeding infant with the formula, MOB pumped with the hospital DEBP. Infant drank 15 mL of formula and then started spitting up clear fluid with some spots of formula in it. LC left infant upright in FOB's arms and encouraged them to call for further assistance as needed.  Maternal Data Has patient been taught Hand Expression?: Yes Does the patient have breastfeeding experience prior to this delivery?: No  Feeding Mother's Current Feeding Choice: Breast Milk Nipple Type: Slow - flow  LATCH Score Latch: Grasps breast easily, tongue down, lips flanged, rhythmical sucking.  Audible Swallowing: Spontaneous and intermittent  Type of Nipple: Everted at rest and after stimulation  Comfort (Breast/Nipple): Soft / non-tender  Hold (Positioning): No assistance needed to correctly position infant at breast.  LATCH Score: 10   Lactation Tools Discussed/Used Tools: Pump;Flanges Flange Size:  (17 mm inserts) Breast pump type: Double-Electric  Breast Pump;Manual Pump Education: Setup, frequency, and cleaning;Milk Storage Reason for Pumping: infant has not stooled Pumping frequency: 15-20 min every 3 hrs Pumped volume: 5 mL  Interventions Interventions: Breast feeding basics reviewed;Assisted with latch;Breast compression;Adjust position;Support pillows;Position options;Expressed milk;Coconut oil;Hand pump;DEBP;Education;Pace feeding;LC Services brochure  Discharge Discharge Education: Engorgement and breast care;Warning signs for feeding baby Pump: DEBP;Personal  Consult Status Consult Status: Follow-up Date: 05/03/24 Follow-up type: In-patient    Recardo Hoit BS, IBCLC 05/02/2024, 8:38 PM

## 2024-05-02 NOTE — Lactation Note (Signed)
 This note was copied from a baby's chart. Lactation Consultation Note  Patient Name: Bethany Aguilar Date: 05/02/2024 Age:32 hours Reason for consult: Follow-up assessment;Mother's request;Primapara;1st time breastfeeding;Term  P1- MOB called out for LC to assist with pace feeding again while MOB pumped. Infant still has not had a bowel movement. FOB reports that he was trying to pace feed infant, but then she had an emesis episode of mucus and formula. LC was able to get infant to take 21 mL of formula. Infant did spit up undigested formula after the feeding. LC encouraged MOB to call for further assistance as needed.  Maternal Data Has patient been taught Hand Expression?: Yes Does the patient have breastfeeding experience prior to this delivery?: No  Feeding Mother's Current Feeding Choice: Breast Milk Nipple Type: Slow - flow  Lactation Tools Discussed/Used Tools: Pump;Flanges Flange Size:  (17mm flanges) Breast pump type: Double-Electric Breast Pump;Manual Pump Education: Setup, frequency, and cleaning;Milk Storage  Interventions Interventions: Breast feeding basics reviewed;Expressed milk;Hand pump;DEBP;Education;Pace feeding;LC Services brochure  Discharge Discharge Education: Engorgement and breast care;Warning signs for feeding baby Pump: DEBP;Personal  Consult Status Consult Status: Follow-up Date: 05/03/24 Follow-up type: In-patient    Recardo Hoit BS, IBCLC 05/02/2024, 11:40 PM

## 2024-05-02 NOTE — Discharge Summary (Signed)
 Postpartum Discharge Summary     Patient Name: Bethany Aguilar DOB: 06-Feb-1991 MRN: [id number]  Date of admission: 04/29/2024 Delivery date:04/30/2024 Delivering provider: DUNN, KATHLEEN T Date of discharge: 05/02/2024  Admitting diagnosis: Post term pregnancy [O48.0] Intrauterine pregnancy: [redacted]w[redacted]d     Secondary diagnosis:  Principal Problem:   Post term pregnancy Active Problems:   Vaginal delivery  Additional problems: Oligohydramnios, thick meconium    Discharge diagnosis: Term Pregnancy Delivered                                              Post partum procedures:NA Augmentation: AROM Complications: None  Hospital course: Induction of Labor With Cesarean Section   33 y.o. yo G1P1001 at [redacted]w[redacted]d was admitted to the hospital 04/29/2024 for induction of labor. Patient had a labor course significant for persistently worsening Category II fetal heart tones with multiple prolonged and late decelerations. The patient went for cesarean section due to Non-Reassuring FHR. Delivery details are as follows: Membrane Rupture Time/Date: 8:37 AM,04/30/2024  Delivery Method:C-Section, Low Transverse Operative Delivery:N/A Details of operation can be found in separate operative Note.  Patient had a postpartum course complicated by nothing. She is ambulating, tolerating a regular diet, passing flatus, and urinating well.  Patient is discharged home in stable condition on 05/02/24.      Newborn Data: Birth date:04/30/2024 Birth time:7:09 PM Gender:Female Living status:Living Apgars:6 ,9  Weight:3940 g                               Magnesium Sulfate received: No BMZ received: No Rhophylac : yes MMR:N/A T-DaP:Given prenatally Flu: No RSV Vaccine received: No Transfusion:No  Immunizations received: Immunization History  Administered Date(s) Administered   Tdap 09/24/2013, 01/26/2024    Physical exam  Vitals:   05/01/24 0946 05/01/24 1343 05/01/24 1942 05/02/24 0507  BP: (!) 97/56  (!) (P) 86/55 (!) 102/44 (!) 103/59  Pulse: 75 (P) 84 85   Resp: (P) 17 (P) 17 17   Temp: 98.2 F (36.8 C) (P) 98.3 F (36.8 C) 98.2 F (36.8 C)   TempSrc: Oral (P) Oral Oral   SpO2: 100% (P) 99%    Weight:      Height:       General: alert, cooperative, and no distress Lochia: appropriate Uterine Fundus: firm Incision: Dressing is clean, dry, and intact DVT Evaluation: No evidence of DVT seen on physical exam. Labs: Lab Results  Component Value Date   WBC 16.6 (H) 05/01/2024   HGB 7.7 (L) 05/01/2024   HCT 22.9 (L) 05/01/2024   MCV 93.1 05/01/2024   PLT 161 05/01/2024      Latest Ref Rng & Units 04/30/2024    9:44 PM  CMP  Creatinine 0.44 - 1.00 mg/dL 9.21    Edinburgh Score:     No data to display         No data recorded  After visit meds:  Allergies as of 05/02/2024   No Known Allergies      Medication List     STOP taking these medications    ondansetron  4 MG tablet Commonly known as: Zofran    oxyCODONE  5 MG immediate release tablet Commonly known as: Roxicodone        TAKE these medications    ACCRUFeR  30 MG Caps Generic drug: Ferric  Maltol Take 1 capsule (30 mg total) by mouth daily.   acetaminophen  500 MG tablet Commonly known as: TYLENOL  Take 2 tablets (1,000 mg total) by mouth every 6 (six) hours as needed for fever or headache.   HYDROcodone -acetaminophen  5-325 MG tablet Commonly known as: NORCO/VICODIN Take 1-2 tablets by mouth every 4 (four) hours as needed for moderate pain (pain score 4-6).   ibuprofen  600 MG tablet Commonly known as: ADVIL  Take 1 tablet (600 mg total) by mouth every 6 (six) hours.   prenatal multivitamin Tabs tablet Take 1 tablet by mouth daily at 12 noon.   senna-docusate 8.6-50 MG tablet Commonly known as: Senokot-S Take 2 tablets by mouth daily.         Discharge home in stable condition Infant Feeding: Breast Infant Disposition:home with mother Discharge instruction: per After Visit Summary  and Postpartum booklet. Activity: Advance as tolerated. Pelvic rest for 6 weeks.  Diet: routine diet Future Appointments: Future Appointments  Date Time Provider Department Center  09/29/2024  4:00 PM Hester Alm BROCKS, MD [facility] None   Follow up Visit: Message sent to [facility] 8/15  Please schedule this patient for a In person postpartum visit in 4 weeks with the following provider: Any provider. Additional Postpartum F/U:Incision check 1 week  Low risk pregnancy complicated by: oligohydramnios and post-dates Delivery mode:  C-Section, Low Transverse Anticipated Birth Control:  Condoms   05/02/2024 Camie DELENA Rote, CNM

## 2024-05-03 ENCOUNTER — Other Ambulatory Visit (HOSPITAL_COMMUNITY): Payer: Self-pay

## 2024-05-03 ENCOUNTER — Telehealth (HOSPITAL_COMMUNITY): Payer: Self-pay | Admitting: Pharmacy Technician

## 2024-05-03 ENCOUNTER — Encounter (HOSPITAL_COMMUNITY): Payer: Self-pay | Admitting: Obstetrics and Gynecology

## 2024-05-03 MED ORDER — IBUPROFEN 600 MG PO TABS
600.0000 mg | ORAL_TABLET | Freq: Four times a day (QID) | ORAL | 0 refills | Status: DC
  Filled 2024-05-03 (×2): qty 30, 8d supply, fill #0

## 2024-05-03 MED ORDER — SENNOSIDES-DOCUSATE SODIUM 8.6-50 MG PO TABS
2.0000 | ORAL_TABLET | ORAL | 0 refills | Status: DC
  Filled 2024-05-03: qty 30, 15d supply, fill #0

## 2024-05-03 MED ORDER — HYDROCODONE-ACETAMINOPHEN 5-325 MG PO TABS
1.0000 | ORAL_TABLET | ORAL | 0 refills | Status: DC | PRN
  Filled 2024-05-03: qty 20, 3d supply, fill #0
  Filled 2024-05-03: qty 20, 2d supply, fill #0

## 2024-05-03 MED ORDER — ACCRUFER 30 MG PO CAPS
30.0000 mg | ORAL_CAPSULE | Freq: Every day | ORAL | 5 refills | Status: DC
  Filled 2024-05-03 (×3): qty 30, 30d supply, fill #0

## 2024-05-03 NOTE — Lactation Note (Signed)
 This note was copied from a baby's chart. Lactation Consultation Note  Patient Name: Bethany Aguilar Unijb'd Date: 05/03/2024 Age:32 hours  RN requested comfort gels for mother.  Mother denies cracked or bleeding, sore.  Given     [name], [name]  RN, IBCLC 05/03/2024, 1:29 PM

## 2024-05-03 NOTE — Telephone Encounter (Signed)
 Pharmacy Patient Advocate Encounter  Received notification from [employer] that Prior Authorization for ACCRUFeR  30MG  capsules  has been APPROVED from 05/03/2024 to 05/03/2025   PA #/Case ID/Reference #: [id number]

## 2024-05-03 NOTE — Telephone Encounter (Signed)
 Pharmacy Patient Advocate Encounter   Received notification from Inpatient Request that prior authorization for ACCRUFeR  30MG  capsule  is required/requested.   Insurance verification completed.   The patient is insured through [facility] .   Per test claim: PA required; PA submitted to above mentioned insurance via Latent Key/confirmation #/EOC [id number] Status is pending

## 2024-05-03 NOTE — Lactation Note (Signed)
 This note was copied from a baby's chart. Lactation Consultation Note  Patient Name: Bethany Aguilar Unijb'd Date: 05/03/2024 Age:32 hours Reason for consult: Follow-up assessment;Primapara;1st time breastfeeding;Term  P1- Infant still has not had a bowel movement and the parents reported that they continued the feeding plan that the NP, RN and LC worked with them on. MOB reports that infant has still been feeding well and she seems to be comfortable with the feedings. MOB reports that she is still pumping and is slowly seeing more volume. MOB denies having any questions or concerns at this time. LC praised MOB for their hard work and determination. LC encouraged MOB to call for further assistance as needed.  Maternal Data Has patient been taught Hand Expression?: Yes Does the patient have breastfeeding experience prior to this delivery?: No  Feeding Mother's Current Feeding Choice: Breast Milk Nipple Type: Slow - flow  Lactation Tools Discussed/Used Tools: Pump;Flanges Flange Size:  (17 mm flange inserts) Breast pump type: Double-Electric Breast Pump;Manual Pump Education: Setup, frequency, and cleaning;Milk Storage Pumping frequency: 15-20 min every 3 hrs  Interventions Interventions: Breast feeding basics reviewed;Hand pump;DEBP;Education;LC Services brochure  Discharge Discharge Education: Engorgement and breast care;Warning signs for feeding baby Pump: DEBP;Personal  Consult Status Consult Status: Follow-up Date: 05/04/24 Follow-up type: In-patient    Recardo Hoit BS, IBCLC 05/03/2024, 6:56 PM

## 2024-05-03 NOTE — Patient Instructions (Signed)
 If interested in an outpatient lactation consult in office or virtually please reach out to us  at [facility] (First Floor) [address], [place]  Please call [phone] and press 4 for lactation.    Lactation support groups:  [facility], Tuesdays 10:00 am -12:00 pm at [address] on the second floor in the conference room, lactating parents and lap babies welcome.  [email or web address]  [email or web address]   Geraldina Louder, [facility] Center for [facility]

## 2024-05-04 NOTE — Lactation Note (Signed)
 This note was copied from a baby's chart.  NICU Lactation Consultation Note  Patient Name: Bethany Aguilar Date: 05/04/2024 Age:32 days  Reason for consult: Follow-up assessment; NICU baby; Primapara; 1st time breastfeeding; Maternal discharge; Term; Other (Comment) (Transfer from the [facility])  SUBJECTIVE Visited with family of 76 49/70 weeks old NICU female; Bethany Aguilar was admitted due to failure to pass meconium. Bethany Aguilar is a P1 and reported she's been taking baby Bethany Aguilar to breast and pumping afterwards, her milk is already in, praised her for all her efforts. Bethany Aguilar voiced her nipples feel better today (see maternal assessment). Parents are taking Bethany Aguilar home today. Reviewed discharge education, lactogenesis II/III, pumping schedule and the importance of pumping after feedings/attempts at the breast for the prevention of engorgement and to protect her supply. Parents politely declined a referral to [facility] OP as they will be F/U with the LC OP on site @ [employer] for lactation care. FOB present and supportive. All questions and concerns answered, family to contact [facility] services PRN.  OBJECTIVE Infant data: Mother's Current Feeding Choice: Breast Milk  O2 Device: Room Air  Maternal data: G1P1001 C-Section, Low Transverse Has patient been taught Hand Expression?: Yes Hand Expression Comments: colostrum noted Current breast feeding challenges:: NICU admission Does the patient have breastfeeding experience prior to this delivery?: No Pumping frequency: 7 times/24 hours Pumped volume: 35 mL Flange Size: -- (17 mm flange inserts) Risk factor for low/delayed milk supply:: primipara, infant separation  Pump: Personal (Spectra  S1)  ASSESSMENT Infant: Latch: Grasps breast easily, tongue down, lips flanged, rhythmical sucking. Audible Swallowing: Spontaneous and intermittent Type of Nipple: Everted at rest and after stimulation Comfort (Breast/Nipple): Soft / non-tender Hold  (Positioning): No assistance needed to correctly position infant at breast. LATCH Score: 10  Feeding Status: Ad lib Feeding method: Breast Nipple Type: Slow - flow  Maternal: Milk volume: Normal Both nipples are intact today but L is a still a bit sensitive  INTERVENTIONS/PLAN Interventions: Interventions: Breast feeding basics reviewed; Coconut oil; DEBP; Education; NICU Pumping Log Discharge Education: Engorgement and breast care; Warning signs for feeding baby; Outpatient recommendation Tools: Coconut oil Pump Education: Setup, frequency, and cleaning; Milk Storage  Plan: Consult Status: Complete   Jerzey Komperda S Aldous Housel 05/04/2024, 1:08 PM

## 2024-05-05 ENCOUNTER — Inpatient Hospital Stay (HOSPITAL_COMMUNITY)
Admission: AD | Admit: 2024-05-05 | Discharge: 2024-05-05 | Disposition: A | Discharge status: Home / Self Care | Attending: Obstetrics & Gynecology | Admitting: Obstetrics & Gynecology

## 2024-05-05 ENCOUNTER — Other Ambulatory Visit: Payer: Self-pay

## 2024-05-05 ENCOUNTER — Inpatient Hospital Stay (HOSPITAL_COMMUNITY)

## 2024-05-05 ENCOUNTER — Telehealth: Payer: Self-pay | Admitting: *Deleted

## 2024-05-05 DIAGNOSIS — T801XXA Vascular complications following infusion, transfusion and therapeutic injection, initial encounter: Secondary | ICD-10-CM | POA: Diagnosis not present

## 2024-05-05 DIAGNOSIS — O9089 Other complications of the puerperium, not elsewhere classified: Secondary | ICD-10-CM | POA: Insufficient documentation

## 2024-05-05 DIAGNOSIS — M7989 Other specified soft tissue disorders: Secondary | ICD-10-CM | POA: Diagnosis present

## 2024-05-05 DIAGNOSIS — Y848 Other medical procedures as the cause of abnormal reaction of the patient, or of later complication, without mention of misadventure at the time of the procedure: Secondary | ICD-10-CM | POA: Insufficient documentation

## 2024-05-05 DIAGNOSIS — T8089XA Other complications following infusion, transfusion and therapeutic injection, initial encounter: Secondary | ICD-10-CM | POA: Diagnosis not present

## 2024-05-05 NOTE — Telephone Encounter (Signed)
 Pt left voice mail stating when was in the hospital and did receive an iron  transfusion and her IV site did get infiltrated. She stated one area is looking better, but another area is sore, swollen, red and warm to touch.   Attempted to call pt back. Left voice mail that it would be best to be evaluated and that she can go back to [facility] to the maternity assessment unit and that I would make the providers aware. Pt can also can call back if she has any questions or concerns

## 2024-05-05 NOTE — MAU Note (Signed)
 Bethany Aguilar is a 33 y.o. at Unknown here in MAU reporting: PP section 8/15 left arm previous IV site sore, warm and red where Iron  infusion was with infiltration. Pain when touching or laying down. Currently site swollen with no warmth or redness. Pt denies excessive VB or other concerns.  LMP:  Onset of complaint: 0300 Pain score: 7/10 when touching but nothing now There were no vitals filed for this visit.   FHT: na  Lab orders placed from triage: none

## 2024-05-05 NOTE — MAU Provider Note (Signed)
 Event Date/Time   First Provider Initiated Contact with Patient 05/05/24 1639      S Ms. Bethany Aguilar is a 33 y.o. G1P1001 patient who presents to MAU today with complaint of pain/swelling at IV site in left arm. Pt had cesarean delivery on 04/30/24 and had a Venofer  infusion postpartum prior to hospital discharge.  IV infiltrated at the end of the infusion and pt had swelling.  The swelling in her hand went down but today she noticed a hard swollen area that felt warm. .   O BP 115/74 (BP Location: Right Arm)   Pulse 83   Temp 98.8 F (37.1 C)   SpO2 100%  Physical Exam Vitals and nursing note reviewed.  Constitutional:      Appearance: She is well-developed.  Cardiovascular:     Rate and Rhythm: Normal rate.  Pulmonary:     Effort: Pulmonary effort is normal.  Abdominal:     Palpations: Abdomen is soft.  Musculoskeletal:        General: Normal range of motion.     Cervical back: Normal range of motion.  Skin:    General: Skin is warm and dry.     Comments: Left arm with raised firm area at IV insertion site, firm, slight erythema, no warmth  Neurological:     Mental Status: She is alert and oriented to person, place, and time.  Psychiatric:        Behavior: Behavior normal.        Thought Content: Thought content normal.        Judgment: Judgment normal.     A Medical screening exam complete 1. Intravenous infiltration, initial encounter      P Discharge from MAU in stable condition NSAIDs, ice to site F/U in [facility] as scheduled Patient may return to MAU as needed   [name] 05/05/2024 4:39 PM

## 2024-05-07 ENCOUNTER — Ambulatory Visit (INDEPENDENT_AMBULATORY_CARE_PROVIDER_SITE_OTHER)

## 2024-05-07 DIAGNOSIS — Z9889 Other specified postprocedural states: Secondary | ICD-10-CM

## 2024-05-07 MED ORDER — FUROSEMIDE 20 MG PO TABS: 20.0000 mg | ORAL_TABLET | Freq: Two times a day (BID) | ORAL | 0 refills | Status: DC

## 2024-05-07 MED ORDER — POTASSIUM CHLORIDE CRYS ER 20 MEQ PO TBCR: 40.0000 meq | EXTENDED_RELEASE_TABLET | Freq: Every day | ORAL | 0 refills | Status: DC

## 2024-05-07 NOTE — Progress Notes (Signed)
 Subjective:     Bethany Aguilar is a 33 y.o. female who presents to the clinic 1 weeks status post Primary low Transverse cesarean section. Pt reports incision is healing well. Pt does report bilateral lower extremity swelling + 2 roughly.      Objective:    There were no vitals taken for this visit. General:  alert, well appearing, in no apparent distress  Incision:   healing well, no drainage, no erythema, no hernia, no seroma, no swelling, no dehiscence, incision well approximated     Assessment:    Doing well postoperatively.   Plan:    1. Continue any current medications. 2. Wound care discussed. 3. Follow up: at already scheduled PPV.   Waddell Renie Baller, CMA

## 2024-05-18 ENCOUNTER — Telehealth: Payer: Self-pay | Admitting: Lactation Services

## 2024-05-18 NOTE — Telephone Encounter (Signed)
 Calling Bethany Aguilar in response to [employer] She sent on 05/17/2025 at 9:06 am that states Hello,  I believe I have a clogged milk duct in my left breast. I started having flu like symptoms Thursday evening and my breast felt tender and had a visible pink patch of skin that felt hard to the touch. The flu like symptoms were gone by Friday afternoon, but the area is still harder to the touch and a bit pink. I was hoping to see if this would require an appointment or if I can be prescribed a medication or told how to treat it at home without an apppintment. I appreciate any advice- thank you!  Called once, no answer, left message. Follow up as needed.  [name] IBCLC

## 2024-06-11 ENCOUNTER — Encounter: Payer: Self-pay | Admitting: Family Medicine

## 2024-06-11 ENCOUNTER — Ambulatory Visit: Admitting: Family Medicine

## 2024-06-11 VITALS — BP 103/64 | HR 69 | Ht 65.0 in | Wt 190.0 lb

## 2024-06-11 DIAGNOSIS — Z98891 History of uterine scar from previous surgery: Secondary | ICD-10-CM | POA: Diagnosis not present

## 2024-06-11 NOTE — Progress Notes (Signed)
 Post Partum Visit Note  Bethany Aguilar is a 33 y.o. G60P1001 female who presents for a postpartum visit. She is 6 weeks postpartum following a primary cesarean section.  I have fully reviewed the prenatal and intrapartum course. The delivery was at 41 gestational weeks.  Anesthesia: spinal. Postpartum course has been good. Baby is doing well. Baby is feeding by breast. Bleeding no bleeding. Bowel function is normal. Bladder function is normal. Patient is not sexually active. Contraception method is none. Postpartum depression screening: negative.   Upstream - 06/11/24 1103       Pregnancy Intention Screening   Does the patient want to become pregnant in the next year? No    Does the patient's partner want to become pregnant in the next year? No    Would the patient like to discuss contraceptive options today? No         The pregnancy intention screening data noted above was reviewed. Potential methods of contraception were discussed. The patient elected to proceed with No data recorded.   Edinburgh Postnatal Depression Scale - 06/11/24 1105       Edinburgh Postnatal Depression Scale:  In the Past 7 Days   I have been able to laugh and see the funny side of things. 0    I have looked forward with enjoyment to things. 0    I have blamed myself unnecessarily when things went wrong. 0    I have been anxious or worried for no good reason. 0    I have felt scared or panicky for no good reason. 0    Things have been getting on top of me. 0    I have been so unhappy that I have had difficulty sleeping. 0    I have felt sad or miserable. 0    I have been so unhappy that I have been crying. 0    The thought of harming myself has occurred to me. 0    Edinburgh Postnatal Depression Scale Total 0          Health Maintenance Due  Topic Date Due   COVID-19 Vaccine (1) Never done   Hepatitis B Vaccines 19-59 Average Risk (1 of 3 - 19+ 3-dose series) Never done   HPV VACCINES (1 -  Risk 3-dose SCDM series) Never done   Influenza Vaccine  Never done    The following portions of the patient's history were reviewed and updated as appropriate: allergies, current medications, past family history, past medical history, past social history, past surgical history, and problem list.  Review of Systems Pertinent items noted in HPI and remainder of comprehensive ROS otherwise negative.  Objective:  BP 103/64   Pulse 69   Ht 5' 5 (1.651 m)   Wt 190 lb (86.2 kg)   LMP 07/18/2023 (Exact Date)   Breastfeeding Yes   BMI 31.62 kg/m    General:  alert, cooperative, and appears stated age   Breasts:  not indicated  Lungs: Normal effort  Heart:  regular rate and rhythm  Abdomen: soft, non-tender; bowel sounds normal; no masses,  no organomegaly   Wound well approximated incision  GU exam:  not indicated       Assessment:    Normal postpartum exam.   Plan:   Essential components of care per ACOG recommendations:  1.  Mood and well being: Patient with negative depression screening today. Reviewed local resources for support.  - Patient tobacco use? No.   - hx  of drug use? No.    2. Infant care and feeding:  -Patient currently breastmilk feeding? Yes. Discussed returning to work and pumping.  -Social determinants of health (SDOH) reviewed in EPIC. No concerns  3. Sexuality, contraception and birth spacing - Patient does not want a pregnancy in the next year.  Desired family size is unknown children.  - Reviewed reproductive life planning. Reviewed contraceptive methods based on pt preferences and effectiveness.  Patient desired Female Condom today.   - Discussed birth spacing of 18 months  4. Sleep and fatigue -Encouraged family/partner/community support of 4 hrs of uninterrupted sleep to help with mood and fatigue  5. Physical Recovery  - Discussed patients delivery and complications. She describes her labor as mixed. - Patient had a C-section emergent. Patient  expressed understanding - Patient has urinary incontinence? No. - Patient is safe to resume physical and sexual activity  6.  Health Maintenance - HM due items addressed Yes - Last pap smear  Diagnosis  Date Value Ref Range Status  11/04/2018   Final   NEGATIVE FOR INTRAEPITHELIAL LESIONS OR MALIGNANCY.   Pap smear not done at today's visit.  -Breast Cancer screening indicated? No.   7. Chronic Disease/Pregnancy Condition follow up: None  - PCP follow up  Glenys GORMAN Birk, MD Center for [facility], [facility]

## 2024-06-14 ENCOUNTER — Encounter: Payer: Self-pay | Admitting: Family Medicine

## 2024-07-22 ENCOUNTER — Ambulatory Visit: Admitting: Family Medicine

## 2024-08-05 ENCOUNTER — Ambulatory Visit: Admitting: Licensed Practical Nurse

## 2024-09-29 ENCOUNTER — Ambulatory Visit

## 2024-09-29 ENCOUNTER — Ambulatory Visit: Admitting: Dermatology

## 2024-09-29 DIAGNOSIS — L578 Other skin changes due to chronic exposure to nonionizing radiation: Secondary | ICD-10-CM

## 2024-09-29 DIAGNOSIS — L821 Other seborrheic keratosis: Secondary | ICD-10-CM | POA: Diagnosis not present

## 2024-09-29 DIAGNOSIS — D2262 Melanocytic nevi of left upper limb, including shoulder: Secondary | ICD-10-CM

## 2024-09-29 DIAGNOSIS — D1801 Hemangioma of skin and subcutaneous tissue: Secondary | ICD-10-CM

## 2024-09-29 DIAGNOSIS — L814 Other melanin hyperpigmentation: Secondary | ICD-10-CM | POA: Diagnosis not present

## 2024-09-29 DIAGNOSIS — D229 Melanocytic nevi, unspecified: Secondary | ICD-10-CM

## 2024-09-29 DIAGNOSIS — W908XXA Exposure to other nonionizing radiation, initial encounter: Secondary | ICD-10-CM | POA: Diagnosis not present

## 2024-09-29 DIAGNOSIS — Z1283 Encounter for screening for malignant neoplasm of skin: Secondary | ICD-10-CM | POA: Diagnosis not present

## 2024-09-29 DIAGNOSIS — D489 Neoplasm of uncertain behavior, unspecified: Secondary | ICD-10-CM | POA: Diagnosis not present

## 2024-09-29 DIAGNOSIS — Z86018 Personal history of other benign neoplasm: Secondary | ICD-10-CM | POA: Diagnosis not present

## 2024-09-29 HISTORY — DX: Melanocytic nevi, unspecified: D22.9

## 2024-09-29 NOTE — Progress Notes (Signed)
 "   Subjective   Bethany Aguilar is a 34 y.o. female who presents for the following: Total body skin exam for skin cancer screening and mole check. The patient has spots, moles and lesions to be evaluated, some may be new or changing and the patient may have concern these could be cancer.. Patient is established patient   Today patient reports: Hx of dysplastic nevi Patient states no areas of concern.   Review of Systems:    No other skin or systemic complaints except as noted in HPI or Assessment and Plan.  The following portions of the chart were reviewed this encounter and updated as appropriate: medications, allergies, medical history  Relevant Medical History:  Family history of skin cancer - Melanoma, BCC, and others (moms side of family)   Objective  (SKPE) Well appearing patient in no apparent distress; mood and affect are within normal limits. Examination was performed of the: Full Skin Examination: scalp, head, eyes, ears, nose, lips, neck, chest, axillae, abdomen, back, buttocks, bilateral upper extremities, bilateral lower extremities, hands, feet, fingers, toes, fingernails, and toenails.   Examination notable for: SKIN EXAM, Angioma(s): Scattered red vascular papule(s)  , Lentigo/lentigines: Scattered pigmented macules that are tan to brown in color and are somewhat non-uniform in shape and concentrated in the sun-exposed areas, Nevus/nevi: Scattered well-demarcated, regular, pigmented macule(s) and/or papule(s)  , Seborrheic Keratosis(es): Stuck-on appearing keratotic papule(s) on the trunk, none  irritated with redness, crusting, edema, and/or partial avulsion, Actinic Damage/Elastosis: chronic sun damage: dyspigmentation, telangiectasia, and wrinkling  4 mm irregular brown pink papule at left lateral upper arm. Examination limited by: Undergarments   Left lateral upper arm 4 mm irregular brown pink papule    Assessment & Plan  (SKAP)   SKIN CANCER SCREENING PERFORMED  TODAY.  BENIGN SKIN FINDINGS  - Lentigines  - Seborrheic keratoses  - Hemangiomas   - Nevus/Multiple Benign Nevi - Reassurance provided regarding the benign appearance of lesions noted on exam today; no treatment is indicated in the absence of symptoms/changes. - Reinforced importance of photoprotective strategies including liberal and frequent sunscreen use of a broad-spectrum SPF 30 or greater, use of protective clothing, and sun avoidance for prevention of cutaneous malignancy and photoaging.  Counseled patient on the importance of regular self-skin monitoring as well as routine clinical skin examinations as scheduled.   ACTINIC DAMAGE - Chronic condition, secondary to cumulative UV/sun exposure - Recommend daily broad spectrum sunscreen SPF 30+ to sun-exposed areas, reapply every 2 hours as needed.  - Staying in the shade or wearing long sleeves, sun glasses (UVA+UVB protection) and wide brim hats (4-inch brim around the entire circumference of the hat) are also recommended for sun protection.  - Call for new or changing lesions.  Personal history of dysplastic nevi  (multiple see history) - Reviewed medical history for full details  - Reviewed sun protective measures as above - Encouraged full body skin exams    - Most recently with severely dysplastic of mid calf 12/2022 - on bx extended close to but did not involve margin. Excision was discussed/recommended but deferred as patient pregnant. No evidence of recurrence today  Personal history of dysplastic nevi at right mid calf - Biopsy 12/29/23 showed severe atypia - Continue to monitor, recheck at follow-up    Was sun protection counseling provided?: Yes   Level of service outlined above   Patient instructions (SKPI)   Procedures, orders, diagnosis for this visit:  NEOPLASM OF UNCERTAIN BEHAVIOR Left lateral upper arm -  Epidermal / dermal shaving  Lesion diameter (cm):  0.4 Informed consent: discussed and consent obtained    Timeout: patient name, date of birth, surgical site, and procedure verified   Procedure prep:  Patient was prepped and draped in usual sterile fashion Prep type:  Isopropyl alcohol Anesthesia: the lesion was anesthetized in a standard fashion   Anesthetic:  1% lidocaine  w/ epinephrine  1-100,000 buffered w/ 8.4% NaHCO3 Instrument used: DermaBlade   Hemostasis achieved with: pressure and aluminum chloride   Outcome: patient tolerated procedure well   Post-procedure details: wound care instructions given    Specimen 1 - Surgical pathology Differential Diagnosis: Dysplastic vs melanoma  Check Margins: Yes 4 mm irregular brown pink papule LENTIGO   SEBORRHEIC KERATOSIS   CHERRY ANGIOMA   MULTIPLE BENIGN NEVI   ACTINIC SKIN DAMAGE   HISTORY OF DYSPLASTIC NEVUS    Neoplasm of uncertain behavior -     Epidermal / dermal shaving -     Surgical pathology; Standing  Lentigo  Seborrheic keratosis  Cherry angioma  Multiple benign nevi  Actinic skin damage  History of dysplastic nevus    Return to clinic: Return in about 6 months (around 03/29/2025) for TBSE, w/ Dr. Raymund.  I, Almetta Nora, RMA, am acting as scribe for Lauraine JAYSON Raymund, MD .   Documentation: I have reviewed the above documentation for accuracy and completeness, and I agree with the above.  Lauraine JAYSON Raymund, MD  "

## 2024-09-29 NOTE — Patient Instructions (Addendum)
 Sunscreen  Who needs sunscreen? Everyone. Sunscreen use can help prevent skin cancer by protecting you from the sun's harmful ultraviolet rays. Anyone can get skin cancer, regardless of age, gender or race. In fact, it is estimated that one in five Americans will develop skin cancer in their lifetime.  Sunscreen alone cannot fully protect you. In addition to wearing sunscreen, dermatologists recommend taking the following steps to protect your skin and find skin cancer early:  Seek shade when appropriate, remembering that the sun's rays are strongest between 10 a.m. and 2 p.m. If your shadow is shorter than you are, seek shade. Dress to protect yourself from the sun by wearing a lightweight long-sleeved shirt, pants, a wide-brimmed hat and sunglasses, when possible.  Use extra caution near water, snow and sand as they reflect the damaging rays of the sun, which can increase your chance of sunburn.  Get vitamin D safely through a healthy diet that may include vitamin supplements. Don't seek the sun. Avoid tanning beds. Ultraviolet light from the sun and tanning beds can cause skin cancer and wrinkling. If you want to look tan, you may wish to use a self-tanning product, but continue to use sunscreen with it.  When should I use sunscreen? Every day you go outside--even if you're just walking to and from your form of transportation. The sun emits harmful UV rays year-round. Even on cloudy days, up to 80 percent of the sun's harmful UV rays can penetrate your skin. Snow, sand and water increase the need for sunscreen because they reflect the sun's rays.  How much sunscreen should I use, and how often should I apply it? Most people only apply 25-50 percent of the recommended amount of sunscreen. Apply enough sunscreen to cover all exposed skin. Most adults need about 1 ounce -- or enough to fill a shot glass -- to fully cover their body.  Don't forget to apply to the tops of your feet, your neck, your ears  and the top of your head. Apply sunscreen to dry skin 15 minutes before going outdoors.  Skin cancer also can form on the lips. To protect your lips, apply a lip balm or lipstick that contains sunscreen with an SPF of 30 or higher.  When outdoors, reapply sunscreen approximately every two hours, or after swimming or sweating, according to the directions on the bottle.   Broad-spectrum sunscreens protect against both UVA and UVB rays. What is the difference between the rays? Sunlight consists of two types of harmful rays that reach the earth -- UVA rays and UVB rays. Overexposure to either can lead to skin cancer. In addition to causing skin cancer, here's what each of these rays do:  UVA rays (or aging rays) can prematurely age your skin, causing wrinkles and age spots, and can pass through window glass. UVB rays (or burning rays) are the primary cause of sunburn and are blocked by window glass  There is no safe way to tan. Every time you tan, you damage your skin. As this damage builds, you speed up the aging of your skin and increase your risk for all types of skin cancer.  What is the difference between chemical and physical sunscreens? Chemical sunscreens work like a sponge, absorbing the sun's rays. They contain one or more of the following active ingredients: oxybenzone, avobenzone, octisalate, octocrylene, homosalate and octinoxate. These formulations tend to be easier to rub into the skin without leaving a white residue.   Physical sunscreens work like a shield,  sitting sit on the surface of your skin and deflecting the sun's rays. They contain the active ingredients zinc oxide and/or titanium dioxide. Use this sunscreen if you have sensitive skin.   What type of sunscreen should I use? The best type of sunscreen is the one you will use again and again. Just make sure it offers broad-spectrum (UVA and UVB) protection, has an SPF of 30+, and is water-resistant. The kind of sunscreen you use is  a matter of personal choice, and may vary depending on the area of the body to be protected. Available sunscreen options include lotions, creams, gels, ointments, wax sticks and sprays.  Recommended physical sunscreens for face: - Neutrogena Sheer Zinc - Aveeno Positively Mineral Sensitive - CeraVe Hydrating Mineral (also has a tinted version) - La Roche-Posay Anthelios Mineral Face (comes as a cream, lotion, light fluid, and there is also a tinted version).  - EltaMD UV Clear (also has a tinted version)  Recommended physical sunscreens for body: - Neutrogena Sheer Zinc Dry-Touch Sunscreen Sensitive Skin Lotion Broad Spectrum SPF 50 - Aveeno Positively Mineral Sensitive Skin Sunscreen Broad Spectrum SPF 50 - La Roche-Posay Anthelios SPF 50 Mineral Sunscreen - Gentle Lotion - CeraVe Hydrating Mineral Sunscreen SPF 50  Recommended chemical sunscreens for face: - Anthelios UV Correct Face Sunscreen SPF 70 with Niacinamide - Neutrogena Clear Face Oil-Free SPF 50 with Helioplex - Neutrogena Sport Face Oil-Free SPF 70+ with Helioplex - Aveeno Protect + Hydrate Sunscreen For Face SPF 70 - La Roche-Posay Anthelios Light Fluid Sunscreen for Face SPF 60  Recommended chemical sunscreens for body: - Neutrogena Ultra Sheer Dry-Touch Sunscreen SPF 70 - Aveeno Protect + Hydrate Broad Spectrum All-Day Hydration SPF 60 (comes in a big pump) - La Roche-Posay Anthelios Melt-In Milk Sunscreen SPF 60   The bandage should remain in place until tomorrow. Remove the bandages and clean the wound once daily as follows:  - Wash your hands. Clean the wound gently with soap and water, and then pat dry. Do not rub. Apply a small amount of Petroleum Jelly or Vaseline. Cover the area with a Band-Aid.  A small amount of bleeding is normal. If bleeding persists, apply firm pressure over the bandage for 5 to 10 minutes without interruption. If bleeding continues, call our office. Continue to clean the area as directed  above until the wound is healed. Shave biopsies may take several weeks to heal. It is normal if the edges are pink/red and the center is slight yellowish or white in color. However if the site becomes hot, swollen, has a thick drainage or redness that expands away from the site please call us . We will contact you with results once available.   Due to recent changes in healthcare laws, you may see results of your pathology and/or laboratory studies on MyChart before the doctors have had a chance to review them. We understand that in some cases there may be results that are confusing or concerning to you. Please understand that not all results are received at the same time and often the doctors may need to interpret multiple results in order to provide you with the best plan of care or course of treatment. Therefore, we ask that you please give us  2 business days to thoroughly review all your results before contacting the office for clarification. Should we see a critical lab result, you will be contacted sooner.   If You Need Anything After Your Visit  If you have any questions or concerns for  your doctor, please call our main line at [phone] and press option 4 to reach your doctor's medical assistant. If no one answers, please leave a voicemail as directed and we will return your call as soon as possible. Messages left after 4 pm will be answered the following business day.   You may also send us  a message via MyChart. We typically respond to MyChart messages within 1-2 business days.  For prescription refills, please ask your pharmacy to contact our office. Our fax number is [phone].  If you have an urgent issue when the clinic is closed that cannot wait until the next business day, you can page your doctor at the number below.    Please note that while we do our best to be available for urgent issues outside of office hours, we are not available 24/7.   If you have an urgent issue and  are unable to reach us , you may choose to seek medical care at your doctor's office, retail clinic, urgent care center, or emergency room.  If you have a medical emergency, please immediately call 911 or go to the emergency department.  Pager Numbers  - Dr. Hester: [phone]  - Dr. Jackquline: [phone]  - Dr. Claudene: [phone]   - Dr. Raymund: [phone]  In the event of inclement weather, please call our main line at [phone] for an update on the status of any delays or closures.  Dermatology Medication Tips: Please keep the boxes that topical medications come in in order to help keep track of the instructions about where and how to use these. Pharmacies typically print the medication instructions only on the boxes and not directly on the medication tubes.   If your medication is too expensive, please contact our office at [phone] option 4 or send us  a message through MyChart.   We are unable to tell what your co-pay for medications will be in advance as this is different depending on your insurance coverage. However, we may be able to find a substitute medication at lower cost or fill out paperwork to get insurance to cover a needed medication.   If a prior authorization is required to get your medication covered by your insurance company, please allow us  1-2 business days to complete this process.  Drug prices often vary depending on where the prescription is filled and some pharmacies may offer cheaper prices.  The website www.goodrx.com contains coupons for medications through different pharmacies. The prices here do not account for what the cost may be with help from insurance (it may be cheaper with your insurance), but the website can give you the price if you did not use any insurance.  - You can print the associated coupon and take it with your prescription to the pharmacy.  - You may also stop by our office during regular business hours and pick up a GoodRx  coupon card.  - If you need your prescription sent electronically to a different pharmacy, notify our office through [facility] or by phone at [phone] option 4.     Si Usted Necesita Algo Despus de Su Visita  Tambin puede enviarnos un mensaje a travs de Clinical Cytogeneticist. Por lo general respondemos a los mensajes de MyChart en el transcurso de 1 a 2 das hbiles.  Para renovar recetas, por favor pida a su farmacia que se ponga en contacto con nuestra oficina. Randi lakes de fax es [place] [phone].  Si tiene un asunto urgente cuando la clnica est cerrada y  que no puede esperar hasta el siguiente da hbil, puede llamar/localizar a su doctor(a) al nmero que aparece a continuacin.   Por favor, tenga en cuenta que aunque hacemos todo lo posible para estar disponibles para asuntos urgentes fuera del horario de [place], no estamos disponibles las 24 horas del da, los 7 [address]  [address] de la [place].   Si tiene un problema urgente y no puede comunicarse con nosotros, puede optar por buscar atencin mdica  en el consultorio de su doctor(a), en una clnica privada, en un centro de atencin urgente o en una sala de emergencias.  Si tiene engineer, drilling, por favor llame inmediatamente al 911 o vaya a la sala de emergencias.  Nmeros de bper  - Dr. Hester: [phone]  - Dra. Jackquline: [phone]  - Dr. Claudene: [phone]  - Dra. Kitts: [phone]  En caso de inclemencias del [place], por favor llame a nuestra lnea principal al [phone] para una actualizacin sobre el estado de cualquier retraso o cierre.  Consejos para la medicacin en dermatologa: Por favor, guarde las cajas en las que vienen los medicamentos de uso tpico para ayudarle a seguir las instrucciones sobre dnde y cmo usarlos. Las farmacias generalmente imprimen las instrucciones del medicamento slo en las cajas y no directamente en los tubos del [place].   Si su medicamento es muy caro, por favor,  pngase en contacto con landry rieger llamando al [phone] y presione la opcin 4 o envenos un mensaje a travs de Clinical Cytogeneticist.   No podemos decirle cul ser su copago por los medicamentos por adelantado ya que esto es diferente dependiendo de la cobertura de su seguro. Sin embargo, es posible que podamos encontrar un medicamento sustituto a audiological scientist un formulario para que el seguro cubra el medicamento que se considera necesario.   Si se requiere una autorizacin previa para que su compaa de seguros cubra su medicamento, por favor permtanos de 1 a 2 das hbiles para completar este proceso.  Los precios de los medicamentos varan con frecuencia dependiendo del environmental consultant de dnde se surte la receta y alguna farmacias pueden ofrecer precios ms baratos.  El sitio web www.goodrx.com tiene cupones para medicamentos de health and safety inspector. Los precios aqu no tienen en cuenta lo que podra costar con la ayuda del seguro (puede ser ms barato con su seguro), pero el sitio web puede darle el precio si no utiliz tourist information centre manager.  - Puede imprimir el cupn correspondiente y llevarlo con su receta a la farmacia.  - Tambin puede pasar por nuestra oficina durante el horario de atencin regular y education officer, museum una tarjeta de cupones de GoodRx.  - Si necesita que su receta se enve electrnicamente a una farmacia diferente, informe a nuestra oficina a travs de MyChart de [place] o por telfono llamando al [phone] y presione la opcin 4.

## 2024-10-04 ENCOUNTER — Ambulatory Visit: Payer: Self-pay

## 2024-10-04 LAB — SURGICAL PATHOLOGY

## 2024-10-04 NOTE — Telephone Encounter (Signed)
-----   Message from Lauraine Kanaris, MD sent at 10/04/2024  4:15 PM EST -----  1. Skin, left lateral upper arm :       ATYPICAL COMBINED MELANOCYTIC NEVUS, DEEP MARGIN INVOLVED, SEE DESCRIPTION   Please notify patient with below plan: Excision

## 2024-10-04 NOTE — Telephone Encounter (Signed)
 Discussed pathology result with patient and surgery scheduled.

## 2024-10-22 ENCOUNTER — Telehealth: Admitting: Emergency Medicine

## 2024-10-22 ENCOUNTER — Telehealth: Admitting: Student

## 2024-10-22 DIAGNOSIS — N3000 Acute cystitis without hematuria: Secondary | ICD-10-CM

## 2024-10-22 MED ORDER — CEPHALEXIN 500 MG PO CAPS: 500.0000 mg | ORAL_CAPSULE | Freq: Two times a day (BID) | ORAL | 0 refills | Status: AC

## 2024-10-22 NOTE — Progress Notes (Signed)
 Duplicate encounter

## 2024-10-22 NOTE — Progress Notes (Signed)

## 2024-10-26 ENCOUNTER — Encounter

## 2025-03-30 ENCOUNTER — Ambulatory Visit
